# Patient Record
Sex: Female | Born: 1940 | Race: White | Hispanic: No | Smoking: Never smoker
Health system: Southern US, Community
[De-identification: ages and names within clinical notes are randomized; demographics above are authoritative.]

## PROBLEM LIST (undated history)

## (undated) DIAGNOSIS — A419 Sepsis, unspecified organism: Secondary | ICD-10-CM

## (undated) DIAGNOSIS — I10 Essential (primary) hypertension: Secondary | ICD-10-CM

## (undated) DIAGNOSIS — G459 Transient cerebral ischemic attack, unspecified: Secondary | ICD-10-CM

## (undated) DIAGNOSIS — N200 Calculus of kidney: Secondary | ICD-10-CM

## (undated) DIAGNOSIS — I4891 Unspecified atrial fibrillation: Secondary | ICD-10-CM

## (undated) HISTORY — DX: Sepsis, unspecified organism: A41.9

## (undated) HISTORY — DX: Transient cerebral ischemic attack, unspecified: G45.9

## (undated) HISTORY — DX: Calculus of kidney: N20.0

## (undated) HISTORY — PX: REPLACEMENT TOTAL KNEE: SUR1224

## (undated) HISTORY — PX: PILONIDAL CYST / SINUS EXCISION: SUR543

## (undated) HISTORY — PX: CHOLECYSTECTOMY: SHX55

## (undated) HISTORY — DX: Essential (primary) hypertension: I10

## (undated) HISTORY — DX: Unspecified atrial fibrillation: I48.91

---

## 2018-04-24 ENCOUNTER — Inpatient Hospital Stay
Admit: 2018-04-24 | Discharge: 2018-05-27 | Disposition: A | Discharge status: Skilled Nursing Facility | Payer: Medicare HMO | Source: Other Acute Inpatient Hospital | Attending: Internal Medicine | Admitting: Internal Medicine

## 2018-04-24 DIAGNOSIS — N179 Acute kidney failure, unspecified: Secondary | ICD-10-CM

## 2018-04-24 DIAGNOSIS — F05 Delirium due to known physiological condition: Secondary | ICD-10-CM | POA: Diagnosis present

## 2018-04-24 DIAGNOSIS — Z931 Gastrostomy status: Secondary | ICD-10-CM

## 2018-04-24 DIAGNOSIS — N189 Chronic kidney disease, unspecified: Secondary | ICD-10-CM

## 2018-04-24 DIAGNOSIS — J189 Pneumonia, unspecified organism: Secondary | ICD-10-CM

## 2018-04-24 DIAGNOSIS — Z09 Encounter for follow-up examination after completed treatment for conditions other than malignant neoplasm: Secondary | ICD-10-CM

## 2018-04-24 DIAGNOSIS — I69319 Unspecified symptoms and signs involving cognitive functions following cerebral infarction: Secondary | ICD-10-CM

## 2018-04-24 DIAGNOSIS — Z4659 Encounter for fitting and adjustment of other gastrointestinal appliance and device: Secondary | ICD-10-CM

## 2018-04-25 LAB — COMPREHENSIVE METABOLIC PANEL
ALT: 11 U/L — ABNORMAL LOW (ref 14–54)
AST: 33 U/L (ref 15–41)
Albumin: 1.9 g/dL — ABNORMAL LOW (ref 3.5–5.0)
Alkaline Phosphatase: 85 U/L (ref 38–126)
Anion gap: 5 (ref 5–15)
BUN: 14 mg/dL (ref 6–20)
CO2: 30 mmol/L (ref 22–32)
Calcium: 11.4 mg/dL — ABNORMAL HIGH (ref 8.9–10.3)
Chloride: 102 mmol/L (ref 101–111)
Creatinine, Ser: 0.97 mg/dL (ref 0.44–1.00)
GFR calc Af Amer: >60 mL/min (ref >60)
GFR calc non Af Amer: 56 mL/min — ABNORMAL LOW (ref >60)
Glucose, Bld: 97 mg/dL (ref 65–99)
Potassium: 4.5 mmol/L (ref 3.5–5.1)
Sodium: 137 mmol/L (ref 135–145)
Total Bilirubin: 0.5 mg/dL (ref 0.3–1.2)
Total Protein: 5.3 g/dL — ABNORMAL LOW (ref 6.5–8.1)

## 2018-04-25 LAB — CBC
HCT: 34.6 % — ABNORMAL LOW (ref 36.0–46.0)
Hemoglobin: 10.8 g/dL — ABNORMAL LOW (ref 12.0–15.0)
MCH: 26.5 pg (ref 26.0–34.0)
MCHC: 31.2 g/dL (ref 30.0–36.0)
MCV: 85 fL (ref 78.0–100.0)
Platelets: 127 10*3/uL — ABNORMAL LOW (ref 150–400)
RBC: 4.07 MIL/uL (ref 3.87–5.11)
RDW: 16.6 % — ABNORMAL HIGH (ref 11.5–15.5)
WBC: 10.8 10*3/uL — ABNORMAL HIGH (ref 4.0–10.5)

## 2018-04-25 LAB — MAGNESIUM: Magnesium: 2 mg/dL (ref 1.7–2.4)

## 2018-04-28 ENCOUNTER — Other Ambulatory Visit (HOSPITAL_COMMUNITY): Payer: Medicare HMO

## 2018-04-28 ENCOUNTER — Institutional Professional Consult (permissible substitution) (HOSPITAL_COMMUNITY): Payer: Medicare HMO

## 2018-04-28 MED ORDER — FLUCONAZOLE 200 MG PO TABS
200.00 | ORAL_TABLET | ORAL | Status: DC
Start: 2018-04-25 — End: 2018-04-28

## 2018-04-28 MED ORDER — ALBUTEROL SULFATE (5 MG/ML) 0.5% IN NEBU
2.50 | INHALATION_SOLUTION | RESPIRATORY_TRACT | Status: DC
Start: ? — End: 2018-04-28

## 2018-04-28 MED ORDER — ALBUTEROL SULFATE HFA 108 (90 BASE) MCG/ACT IN AERS
2.00 | INHALATION_SPRAY | RESPIRATORY_TRACT | Status: DC
Start: ? — End: 2018-04-28

## 2018-04-28 MED ORDER — GENERIC EXTERNAL MEDICATION
1.00 | Status: DC
Start: 2018-04-25 — End: 2018-04-28

## 2018-04-28 MED ORDER — GENERIC EXTERNAL MEDICATION
2.00 | Status: DC
Start: 2018-04-25 — End: 2018-04-28

## 2018-04-28 MED ORDER — GUAIFENESIN-DM 100-10 MG/5ML PO SYRP
5.00 | ORAL_SOLUTION | ORAL | Status: DC
Start: ? — End: 2018-04-28

## 2018-04-28 MED ORDER — ATORVASTATIN CALCIUM 10 MG PO TABS
20.00 | ORAL_TABLET | ORAL | Status: DC
Start: 2018-04-25 — End: 2018-04-28

## 2018-04-28 MED ORDER — LIDOCAINE 4 % EX PTCH
1.00 | MEDICATED_PATCH | CUTANEOUS | Status: DC
Start: 2018-04-25 — End: 2018-04-28

## 2018-04-28 MED ORDER — DIPHENHYDRAMINE HCL 25 MG PO CAPS
25.00 | ORAL_CAPSULE | ORAL | Status: DC
Start: ? — End: 2018-04-28

## 2018-04-28 MED ORDER — FAMOTIDINE 20 MG PO TABS
40.00 | ORAL_TABLET | ORAL | Status: DC
Start: 2018-04-25 — End: 2018-04-28

## 2018-04-28 MED ORDER — OXYCODONE-ACETAMINOPHEN 5-325 MG PO TABS
1.00 | ORAL_TABLET | ORAL | Status: DC
Start: ? — End: 2018-04-28

## 2018-04-28 MED ORDER — APIXABAN 5 MG PO TABS
5.00 | ORAL_TABLET | ORAL | Status: DC
Start: 2018-04-25 — End: 2018-04-28

## 2018-04-28 MED ORDER — ALUMINUM-MAGNESIUM-SIMETHICONE 200-200-20 MG/5ML PO SUSP
30.00 | ORAL | Status: DC
Start: ? — End: 2018-04-28

## 2018-04-28 MED ORDER — GENERIC EXTERNAL MEDICATION
1.00 g | Status: DC
Start: 2018-04-25 — End: 2018-04-28

## 2018-04-28 MED ORDER — METOPROLOL SUCCINATE ER 50 MG PO TB24
50.00 | ORAL_TABLET | ORAL | Status: DC
Start: 2018-04-25 — End: 2018-04-28

## 2018-04-28 MED ORDER — TRAMADOL HCL 50 MG PO TABS
50.00 | ORAL_TABLET | ORAL | Status: DC
Start: ? — End: 2018-04-28

## 2018-04-28 MED ORDER — HYDRALAZINE HCL 20 MG/ML IJ SOLN
10.00 | INTRAMUSCULAR | Status: DC
Start: ? — End: 2018-04-28

## 2018-04-28 MED ORDER — IPRATROPIUM BROMIDE 0.02 % IN SOLN
.50 | RESPIRATORY_TRACT | Status: DC
Start: ? — End: 2018-04-28

## 2018-04-28 MED ORDER — IOHEXOL 300 MG/ML  SOLN
100.0000 mL | Freq: Once | INTRAMUSCULAR | Status: AC | PRN
  Administered 2018-04-28: 100 mL via INTRAVENOUS

## 2018-04-28 MED ORDER — CLOTRIMAZOLE 1 % EX CREA
TOPICAL_CREAM | CUTANEOUS | Status: DC
Start: 2018-04-25 — End: 2018-04-28

## 2018-04-28 MED ORDER — MAGNESIUM OXIDE 400 MG PO TABS
400.00 | ORAL_TABLET | ORAL | Status: DC
Start: 2018-04-25 — End: 2018-04-28

## 2018-04-28 MED ORDER — GENERIC EXTERNAL MEDICATION
6.00 | Status: DC
Start: 2018-04-25 — End: 2018-04-28

## 2018-04-28 MED ORDER — GENERIC EXTERNAL MEDICATION
300.00 | Status: DC
Start: 2018-04-25 — End: 2018-04-28

## 2018-04-28 MED ORDER — IOPAMIDOL (ISOVUE-300) INJECTION 61%
30.0000 mL | INTRAVENOUS | Status: AC
Start: 2018-04-28 — End: 2018-04-28

## 2018-04-28 MED ORDER — ONDANSETRON HCL 4 MG/2ML IJ SOLN
4.00 | INTRAMUSCULAR | Status: DC
Start: ? — End: 2018-04-28

## 2018-04-28 MED ORDER — ACETAMINOPHEN 325 MG PO TABS
650.00 | ORAL_TABLET | ORAL | Status: DC
Start: ? — End: 2018-04-28

## 2018-04-28 MED ORDER — BISACODYL 5 MG PO TBEC
10.00 | DELAYED_RELEASE_TABLET | ORAL | Status: DC
Start: ? — End: 2018-04-28

## 2018-04-28 MED ORDER — MIDODRINE HCL 5 MG PO TABS
5.00 | ORAL_TABLET | ORAL | Status: DC
Start: 2018-04-25 — End: 2018-04-28

## 2018-05-01 LAB — CK: Total CK: 131 U/L (ref 38–234)

## 2018-05-03 DIAGNOSIS — I69319 Unspecified symptoms and signs involving cognitive functions following cerebral infarction: Secondary | ICD-10-CM

## 2018-05-03 DIAGNOSIS — F05 Delirium due to known physiological condition: Secondary | ICD-10-CM | POA: Diagnosis present

## 2018-05-03 LAB — POTASSIUM: Potassium: 3.3 mmol/L — ABNORMAL LOW (ref 3.5–5.1)

## 2018-05-03 NOTE — Consult Note (Signed)
[facility] Face-to-Face Psychiatry Consult   Reason for Consult: confusion, combative Referring Physician:  Dr. 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  HPI:   Patient is a 77 year old woman with history of Cerebrovascular accident, new onset Afib, acute on chronic renal failure, sacral decubitus and pneumonia who was found to have Sepsis, a femoral/iliopsoas abscess and acute left CVA prior to her admission to this hospital. Per chart review, patient was treated at [facility] where her hospital stay was complicated with post stroke delirium and psychosis. Patient is a poor historian and information were obtained from sister in law by her bedside, treating physician and her chart. Patient was reported to be aggressive, combative in the last few days which warranted antipsychotic medication management. Per report, she is showing some improvement today. She is alert and able to engage in some conversation but still appears confused. She is oriented to person but not to time and place. She does not appear combative but continues to thinks some people are out to get her and was indicating that some people a hovering around the window in her hospital room. Patient denies depressive symptoms or anxiety. She denies prior history of trauma.  Past Psychiatric History: unknown  Risk to Self:  denies Risk to Others:  denies Prior Inpatient Therapy:   Prior Outpatient Therapy:    Past Medical History: as in HPI Family History: No family history on file. Family Psychiatric  History: unknown Social History:  Social History   Substance and  Sexual Activity  Alcohol Use Not on file     Social History   Substance and Sexual Activity  Drug Use Not on file    Social History   Socioeconomic History  . Marital status: Unknown    Spouse name: Not on file  . Number of children: Not on file  . Years of education: Not on file  . Highest education level: Not on file  Occupational History  . Not on file  Social Needs  . Financial resource strain: Not on file  . Food insecurity:    Worry: Not on file    Inability: Not on file  . Transportation needs:    Medical: Not on file    Non-medical: Not on file  Tobacco Use  . Smoking status: Not on file  Substance and Sexual Activity  . Alcohol use: Not on file  . Drug use: Not on file  . Sexual activity: Not on file  Lifestyle  . Physical activity:    Days per week: Not on file    Minutes per session: Not on file  . Stress: Not on file  Relationships  . Social connections:    Talks on phone: Not on file    Gets together: Not on file    Attends religious service: Not on file    Active member of club or organization: Not on file    Attends meetings of clubs or organizations: Not on file    Relationship status: Not on file  Other Topics Concern  . Not on file  Social History Narrative  . Not on file   Additional Social History:  Allergies:  Allergies not on file  Labs:  Results for orders placed or performed during the hospital encounter of 04/24/18 (from the past 48 hour(s))  Potassium     Status: Abnormal   Collection Time: 05/03/18 11:40 AM  Result Value Ref Range   Potassium 3.3 (L) 3.5 - 5.1 mmol/L    Comment: Performed at [facility], [address] [place], [place] [address]    No current facility-administered medications for this encounter.     Musculoskeletal: Strength & Muscle Tone: within normal limits Gait & Station: unable to stand Patient leans: N/A  Psychiatric Specialty Exam: Physical Exam  Psychiatric: Judgment normal. Her  mood appears anxious. Her speech is delayed and tangential. She is actively hallucinating and combative. Thought content is paranoid. Cognition and memory are impaired.    Review of Systems  Constitutional: Positive for malaise/fatigue.  HENT: Negative.   Eyes: Negative.   Cardiovascular: Negative.   Gastrointestinal: Negative.   Genitourinary: Negative.   Skin: Negative.   Neurological: Positive for tremors.  Endo/Heme/Allergies: Negative.   Psychiatric/Behavioral: Positive for memory loss.    There were no vitals taken for this visit.There is no height or weight on file to calculate BMI.  General Appearance: Casual  Eye Contact:  Fair  Speech:  Pressured and Slow  Volume:  Decreased  Mood:  combative  Affect:  Blunt  Thought Process:  Disorganized  Orientation:  Other:  only to person  Thought Content:  Illogical, Delusions and Hallucinations: Visual  Suicidal Thoughts:  No  Homicidal Thoughts:  No  Memory:  Immediate;   Fair Recent;   Poor Remote;   Fair  Judgement:  Other:  marginal  Insight:  Shallow  Psychomotor Activity:  Restlessness  Concentration:  Concentration: Fair and Attention Span: Fair  Recall:  Poor  Fund of Knowledge:  Fair  Language:  Fair  Akathisia:  No  Handed:  Right  AIMS (if indicated):     Assets:  Desire for Improvement Social Support  ADL's:  Impaired  Cognition:  Impaired,  Mild  Sleep:   poor     Treatment Plan Summary: Patient with multiple medical problem who was found to have Sepsis,  femoral/iliopsoas abscess and acute left CVA. Since then, she has become more combative, paranoid, confused, disoriented with memory lapses.  The cause of her current mental status is unknown but may be explained by sudden stress, infection and or recent acute left CVA.   Plan/Recommendations: -Chart review. -Collateral information -Dementia work up. -Encourage family members to try to be at the patient's  bedside as much as they can. -Increase  Mirtazapine to 30 mg at bedtime for mood/sleep/appetite. -Continue Risperdal 0.25 mg for psychosis/paranoia/aggression.  Disposition:  Will follow up patient in 2 weeks. Re-consult as needed .  Thedore MinsMojeed Gali Spinney, MD 05/03/2018 3:30 PM

## 2018-05-04 LAB — COMPREHENSIVE METABOLIC PANEL
ALT: 35 U/L (ref 0–44)
ANION GAP: 11 (ref 5–15)
AST: 101 U/L — ABNORMAL HIGH (ref 15–41)
Albumin: 2 g/dL — ABNORMAL LOW (ref 3.5–5.0)
Alkaline Phosphatase: 74 U/L (ref 38–126)
BUN: 16 mg/dL (ref 8–23)
CHLORIDE: 109 mmol/L (ref 98–111)
CO2: 29 mmol/L (ref 22–32)
Calcium: 13.1 mg/dL (ref 8.9–10.3)
Creatinine, Ser: 1.74 mg/dL — ABNORMAL HIGH (ref 0.44–1.00)
GFR calc non Af Amer: 28 mL/min — ABNORMAL LOW (ref >60)
GFR, EST AFRICAN AMERICAN: 32 mL/min — AB (ref >60)
Glucose, Bld: 74 mg/dL (ref 70–99)
POTASSIUM: 3.2 mmol/L — AB (ref 3.5–5.1)
SODIUM: 149 mmol/L — AB (ref 135–145)
Total Bilirubin: 0.8 mg/dL (ref 0.3–1.2)
Total Protein: 5.3 g/dL — ABNORMAL LOW (ref 6.5–8.1)

## 2018-05-04 LAB — CBC
HCT: 33.4 % — ABNORMAL LOW (ref 36.0–46.0)
Hemoglobin: 9.8 g/dL — ABNORMAL LOW (ref 12.0–15.0)
MCH: 26.3 pg (ref 26.0–34.0)
MCHC: 29.3 g/dL — AB (ref 30.0–36.0)
MCV: 89.8 fL (ref 78.0–100.0)
PLATELETS: 274 10*3/uL (ref 150–400)
RBC: 3.72 MIL/uL — ABNORMAL LOW (ref 3.87–5.11)
RDW: 17.8 % — ABNORMAL HIGH (ref 11.5–15.5)
WBC: 10.2 10*3/uL (ref 4.0–10.5)

## 2018-05-04 LAB — T4, FREE: FREE T4: 0.72 ng/dL — AB (ref 0.82–1.77)

## 2018-05-04 LAB — TSH: TSH: 5.386 u[IU]/mL — ABNORMAL HIGH (ref 0.350–4.500)

## 2018-05-04 LAB — VITAMIN B12: VITAMIN B 12: 1358 pg/mL — AB (ref 180–914)

## 2018-05-05 LAB — PARATHYROID HORMONE, INTACT (NO CA): PTH: 15 pg/mL (ref 15–65)

## 2018-05-06 ENCOUNTER — Institutional Professional Consult (permissible substitution) (HOSPITAL_COMMUNITY): Payer: Medicare HMO

## 2018-05-06 LAB — CBC
HCT: 30.6 % — ABNORMAL LOW (ref 36.0–46.0)
Hemoglobin: 8.8 g/dL — ABNORMAL LOW (ref 12.0–15.0)
MCH: 26.4 pg (ref 26.0–34.0)
MCHC: 28.8 g/dL — AB (ref 30.0–36.0)
MCV: 91.9 fL (ref 78.0–100.0)
PLATELETS: 301 10*3/uL (ref 150–400)
RBC: 3.33 MIL/uL — ABNORMAL LOW (ref 3.87–5.11)
RDW: 18.3 % — AB (ref 11.5–15.5)
WBC: 10.3 10*3/uL (ref 4.0–10.5)

## 2018-05-06 LAB — BASIC METABOLIC PANEL
Anion gap: 8 (ref 5–15)
BUN: 17 mg/dL (ref 8–23)
CO2: 30 mmol/L (ref 22–32)
CREATININE: 1.67 mg/dL — AB (ref 0.44–1.00)
Calcium: 12.6 mg/dL — ABNORMAL HIGH (ref 8.9–10.3)
Chloride: 115 mmol/L — ABNORMAL HIGH (ref 98–111)
GFR calc Af Amer: 34 mL/min — ABNORMAL LOW (ref >60)
GFR, EST NON AFRICAN AMERICAN: 29 mL/min — AB (ref >60)
GLUCOSE: 84 mg/dL (ref 70–99)
Potassium: 3.6 mmol/L (ref 3.5–5.1)
SODIUM: 153 mmol/L — AB (ref 135–145)

## 2018-05-06 MED ORDER — IOPAMIDOL (ISOVUE-300) INJECTION 61%
INTRAVENOUS | Status: AC
  Filled 2018-05-06: qty 50

## 2018-05-08 LAB — BLOOD GAS, ARTERIAL
Acid-Base Excess: 0.8 mmol/L (ref 0.0–2.0)
Bicarbonate: 24 mmol/L (ref 20.0–28.0)
FIO2: 21
O2 SAT: 97.5 %
PCO2 ART: 30.7 mmHg — AB (ref 32.0–48.0)
PH ART: 7.499 — AB (ref 7.350–7.450)
Patient temperature: 96.4
pO2, Arterial: 84.5 mmHg (ref 83.0–108.0)

## 2018-05-08 LAB — BASIC METABOLIC PANEL
ANION GAP: 7 (ref 5–15)
BUN: 23 mg/dL (ref 8–23)
CALCIUM: 13.2 mg/dL — AB (ref 8.9–10.3)
CHLORIDE: 113 mmol/L — AB (ref 98–111)
CO2: 28 mmol/L (ref 22–32)
Creatinine, Ser: 1.64 mg/dL — ABNORMAL HIGH (ref 0.44–1.00)
GFR calc non Af Amer: 30 mL/min — ABNORMAL LOW (ref >60)
GFR, EST AFRICAN AMERICAN: 34 mL/min — AB (ref >60)
Glucose, Bld: 91 mg/dL (ref 70–99)
Potassium: 3 mmol/L — ABNORMAL LOW (ref 3.5–5.1)
Sodium: 148 mmol/L — ABNORMAL HIGH (ref 135–145)

## 2018-05-09 LAB — BASIC METABOLIC PANEL
ANION GAP: 7 (ref 5–15)
Anion gap: 4 — ABNORMAL LOW (ref 5–15)
Anion gap: 6 (ref 5–15)
BUN: 31 mg/dL — ABNORMAL HIGH (ref 8–23)
BUN: 30 mg/dL — AB (ref 8–23)
BUN: 30 mg/dL — ABNORMAL HIGH (ref 8–23)
CALCIUM: 12.9 mg/dL — AB (ref 8.9–10.3)
CHLORIDE: 110 mmol/L (ref 98–111)
CHLORIDE: 111 mmol/L (ref 98–111)
CHLORIDE: 112 mmol/L — AB (ref 98–111)
CO2: 26 mmol/L (ref 22–32)
CO2: 30 mmol/L (ref 22–32)
CO2: 28 mmol/L (ref 22–32)
CREATININE: 1.83 mg/dL — AB (ref 0.44–1.00)
Calcium: 13.2 mg/dL (ref 8.9–10.3)
Calcium: 12.8 mg/dL — ABNORMAL HIGH (ref 8.9–10.3)
Creatinine, Ser: 1.88 mg/dL — ABNORMAL HIGH (ref 0.44–1.00)
Creatinine, Ser: 1.73 mg/dL — ABNORMAL HIGH (ref 0.44–1.00)
GFR calc Af Amer: 29 mL/min — ABNORMAL LOW (ref >60)
GFR calc non Af Amer: 25 mL/min — ABNORMAL LOW (ref >60)
GFR, EST AFRICAN AMERICAN: 30 mL/min — AB (ref >60)
GFR, EST AFRICAN AMERICAN: 32 mL/min — AB (ref >60)
GFR, EST NON AFRICAN AMERICAN: 26 mL/min — AB (ref >60)
GFR, EST NON AFRICAN AMERICAN: 28 mL/min — AB (ref >60)
GLUCOSE: 95 mg/dL (ref 70–99)
Glucose, Bld: 94 mg/dL (ref 70–99)
Glucose, Bld: 84 mg/dL (ref 70–99)
Potassium: 4.1 mmol/L (ref 3.5–5.1)
Potassium: 3.6 mmol/L (ref 3.5–5.1)
Potassium: 3.7 mmol/L (ref 3.5–5.1)
SODIUM: 144 mmol/L (ref 135–145)
SODIUM: 145 mmol/L (ref 135–145)
Sodium: 145 mmol/L (ref 135–145)

## 2018-05-10 LAB — CALCIUM, IONIZED: Calcium, Ionized, Serum: 8 mg/dL — ABNORMAL HIGH (ref 4.5–5.6)

## 2018-05-10 LAB — BASIC METABOLIC PANEL
ANION GAP: 6 (ref 5–15)
ANION GAP: 5 (ref 5–15)
BUN: 35 mg/dL — ABNORMAL HIGH (ref 8–23)
BUN: 32 mg/dL — ABNORMAL HIGH (ref 8–23)
CALCIUM: 11.9 mg/dL — AB (ref 8.9–10.3)
CHLORIDE: 114 mmol/L — AB (ref 98–111)
CO2: 24 mmol/L (ref 22–32)
CO2: 28 mmol/L (ref 22–32)
Calcium: 11.5 mg/dL — ABNORMAL HIGH (ref 8.9–10.3)
Chloride: 112 mmol/L — ABNORMAL HIGH (ref 98–111)
Creatinine, Ser: 1.62 mg/dL — ABNORMAL HIGH (ref 0.44–1.00)
Creatinine, Ser: 1.55 mg/dL — ABNORMAL HIGH (ref 0.44–1.00)
GFR calc Af Amer: 37 mL/min — ABNORMAL LOW (ref >60)
GFR calc non Af Amer: 32 mL/min — ABNORMAL LOW (ref >60)
GFR, EST AFRICAN AMERICAN: 35 mL/min — AB (ref >60)
GFR, EST NON AFRICAN AMERICAN: 30 mL/min — AB (ref >60)
Glucose, Bld: 75 mg/dL (ref 70–99)
Glucose, Bld: 73 mg/dL (ref 70–99)
POTASSIUM: 3.7 mmol/L (ref 3.5–5.1)
POTASSIUM: 3.4 mmol/L — AB (ref 3.5–5.1)
SODIUM: 144 mmol/L (ref 135–145)
SODIUM: 145 mmol/L (ref 135–145)

## 2018-05-10 LAB — MAGNESIUM: Magnesium: 2 mg/dL (ref 1.7–2.4)

## 2018-05-11 ENCOUNTER — Other Ambulatory Visit (HOSPITAL_COMMUNITY): Payer: Medicare HMO

## 2018-05-11 NOTE — Consult Note (Signed)
[facility] CONSULT NOTE    Date: 05/11/2018                  Patient Name:  Bridget Riley  MRN: [id number]  DOB: 07/16/1941  Age / Sex: 77 y.o., female         PCP: No primary care provider on file.                 Service Requesting Consult: Hospitalist                 Reason for Consult: Acute renal failure, Hypercalcemia            History of Present Illness: Patient is a 77 y.o. female with a PMHx of large right staghorn calculus nephrolithiasis, chronic kidney disease stage III, hyperlipidemia, obstructive sleep apnea, chronic low back pain, obesity, recurrent urinary tract infection, chronic hypoxic respiratory failure, acute left temporal ischemic stroke, recent large left iliopsoas femoral abscess status post IR guided drainage April 10, 2018, who [facility] for ongoing treatment.  As above she originally presented with left iliopsoas abscess as well as a left perinephric abscess.  Patient underwent IR guided drainage on April 10, 2018.  She subsequently had a drain placed in the left perinephric region on April 13, 2018.  She was treated with antibiotic therapy.  She also has a large staghorn calculus in the right kidney and is to follow-up with urology as an outpatient.  She cannot provide any history at this point in time.  The patient's baseline creatinine appears to be 0.96.  Her calcium has also risen and is currently 11.9.  It has been as high as 13.2.   Medications: Outpatient medications:  Current medications: Amantadine 100 mg daily, Lipitor 20 mg daily, Biotene 45 mL's 4 times daily, Cubicin 500 mg every 48 hours, Eliquis 5 mg twice daily, famotidine 20 mg twice daily, ferrous sulfate 324 mg daily, fish oil 6 g daily, folic acid 1 mg daily, levothyroxine 25 mcg p.o. daily, Lidoderm patch, metoprolol 50 mg daily, modafinil 100 mg daily, pro-stat 30 cc 3 times daily, Risperdal 0.25 mg nightly, vitamin C 500 mg  daily  Allergies: Demeclocycline, montelukast, penicillins, sulfasalazine, prednisone, aspirin   Past Medical History: large right staghorn calculus nephrolithiasis, chronic kidney disease stage III, hyperlipidemia, obstructive sleep apnea, chronic low back pain, obesity, recurrent urinary tract infection, chronic hypoxic respiratory failure, acute left temporal ischemic stroke, recent large left iliopsoas femoral abscess status post IR guided drainage April 10, 2018  Past Surgical History: Left perinephric abscess status post drain placement Left iliopsoas abscess status post drain placement  Family History: No family history of ESRD.  Social History: Son lives with her, no tobacco, alcohol, illicit drug use.  Review of Systems: Unable to obtain from the patient as she is lethargic.  Vital Signs: Temperature 96.5 pulse 85 respirations 20 blood pressure 115/70 Weight trends: There were no vitals filed for this visit.  Physical Exam: General: NAD, laying in bed  Head: Normocephalic, atraumatic.  Eyes: Anicteric, EOMI  Nose: Mucous membranes dry, not inflammed, nonerythematous.  Throat: Oropharynx nonerythematous, no exudate appreciated.   Neck: Supple, trachea midline.  Lungs:  Normal respiratory effort. Clear to auscultation BL without crackles or wheezes.  Heart: RRR. S1 and S2 normal without gallop, murmur, or rubs.  Abdomen:  BS normoactive. Soft, Nondistended, non-tender.  No masses or organomegaly.  Extremities: No pretibial edema.  Neurologic: Lethargic but arousable  Skin: No visible rashes, scars.    Lab results: Basic Metabolic Panel: Recent Labs  Lab 05/09/18 1912 05/10/18 0811 05/10/18 1434  NA 145 144 145  K 3.7 3.7 3.4*  CL 111 114* 112*  CO2 28 24 28   GLUCOSE 84 75 73  BUN 30* 35* 32*  CREATININE 1.73* 1.62* 1.55*  CALCIUM 12.9* 11.5* 11.9*  MG  --   --  2.0    Liver Function Tests: No results for input(s): AST, ALT, ALKPHOS, BILITOT, PROT,  ALBUMIN in the last 168 hours. No results for input(s): LIPASE, AMYLASE in the last 168 hours. No results for input(s): AMMONIA in the last 168 hours.  CBC: Recent Labs  Lab 05/06/18 1040  WBC 10.3  HGB 8.8*  HCT 30.6*  MCV 91.9  PLT 301    Cardiac Enzymes: No results for input(s): CKTOTAL, CKMB, CKMBINDEX, TROPONINI in the last 168 hours.  BNP: Invalid input(s): POCBNP  CBG: No results for input(s): GLUCAP in the last 168 hours.  Microbiology: No results found for this or any previous visit.  Coagulation Studies: No results for input(s): LABPROT, INR in the last 72 hours.  Urinalysis: No results for input(s): COLORURINE, LABSPEC, PHURINE, GLUCOSEU, HGBUR, BILIRUBINUR, KETONESUR, PROTEINUR, UROBILINOGEN, NITRITE, LEUKOCYTESUR in the last 72 hours.  Invalid input(s): APPERANCEUR    Imaging:  No results found.   Assessment & Plan: Pt is a 77 y.o. female with a PMHx of large right staghorn calculus nephrolithiasis, chronic kidney disease stage III, hyperlipidemia, obstructive sleep apnea, chronic low back pain, obesity, recurrent urinary tract infection, chronic hypoxic respiratory failure, acute left temporal ischemic stroke, recent large left iliopsoas femoral abscess status post IR guided drainage April 10, 2018, who was [facility] for ongoing treatment.   1.  Acute renal failure. 2.  CKD stage III baseline EGFR 58. 3.  Hypercalcemia, suspect due to dehydration.  4.  Staghorn calculi R > L in size.   Plan:  We are asked to see the patient for evaluation management of acute renal failure.  Creatinine down to 1.5.  We will check renal ultrasound to make sure there is no underlying obstruction but suspect that hypercalcemia was playing a role in her acute renal failure.  In terms of hypercalcemia serum calcium was as high as 13.2.  The medronate has been ordered.  Calcium currently down to 11.9.  Suspect most likely that this is due to  dehydration.  We will start the patient on 0.9 normal saline at 40 cc/h.  Also check SPEP, UPEP, and intact PTH.  Further plan to be determined once his labs are available.  Thanks for consultation.

## 2018-05-12 LAB — BASIC METABOLIC PANEL
Anion gap: 4 — ABNORMAL LOW (ref 5–15)
BUN: 32 mg/dL — ABNORMAL HIGH (ref 8–23)
CHLORIDE: 114 mmol/L — AB (ref 98–111)
CO2: 29 mmol/L (ref 22–32)
Calcium: 12.1 mg/dL — ABNORMAL HIGH (ref 8.9–10.3)
Creatinine, Ser: 1.58 mg/dL — ABNORMAL HIGH (ref 0.44–1.00)
GFR calc non Af Amer: 31 mL/min — ABNORMAL LOW (ref >60)
GFR, EST AFRICAN AMERICAN: 36 mL/min — AB (ref >60)
Glucose, Bld: 112 mg/dL — ABNORMAL HIGH (ref 70–99)
POTASSIUM: 3.4 mmol/L — AB (ref 3.5–5.1)
SODIUM: 147 mmol/L — AB (ref 135–145)

## 2018-05-13 LAB — PROTEIN ELECTROPHORESIS, SERUM
A/G Ratio: 0.6 — ABNORMAL LOW (ref 0.7–1.7)
ALPHA-1-GLOBULIN: 0.3 g/dL (ref 0.0–0.4)
ALPHA-2-GLOBULIN: 0.6 g/dL (ref 0.4–1.0)
Albumin ELP: 1.7 g/dL — ABNORMAL LOW (ref 2.9–4.4)
Beta Globulin: 0.9 g/dL (ref 0.7–1.3)
GAMMA GLOBULIN: 0.9 g/dL (ref 0.4–1.8)
Globulin, Total: 2.8 g/dL (ref 2.2–3.9)
Total Protein ELP: 4.5 g/dL — ABNORMAL LOW (ref 6.0–8.5)

## 2018-05-13 LAB — BASIC METABOLIC PANEL
Anion gap: 4 — ABNORMAL LOW (ref 5–15)
BUN: 31 mg/dL — ABNORMAL HIGH (ref 8–23)
CHLORIDE: 111 mmol/L (ref 98–111)
CO2: 28 mmol/L (ref 22–32)
Calcium: 12.3 mg/dL — ABNORMAL HIGH (ref 8.9–10.3)
Creatinine, Ser: 1.49 mg/dL — ABNORMAL HIGH (ref 0.44–1.00)
GFR, EST AFRICAN AMERICAN: 39 mL/min — AB (ref >60)
GFR, EST NON AFRICAN AMERICAN: 33 mL/min — AB (ref >60)
Glucose, Bld: 86 mg/dL (ref 70–99)
Potassium: 3.4 mmol/L — ABNORMAL LOW (ref 3.5–5.1)
Sodium: 143 mmol/L (ref 135–145)

## 2018-05-13 NOTE — Progress Notes (Signed)
[facility]  ROUNDING NOTE   Subjective:  Patient seen at bedside. Calcium still high at 12.3. Patient resting comfortably in bed at the moment.   Objective:  Vital signs in last 24 hours:  Temperature 98.6 pulse 78 respiration 17 blood pressure 111/53  Physical Exam: General: No acute distress  Head: Normocephalic, atraumatic. Moist oral mucosal membranes  Eyes: Anicteric  Neck: Supple, trachea midline  Lungs:  Clear to auscultation, normal effort  Heart: S1S2 no rubs  Abdomen:  Soft, nontender, bowel sounds present  Extremities: 1+ peripheral edema.  Neurologic: Lethargic but arousable  Skin: No rashes       Basic Metabolic Panel: Recent Labs  Lab 05/09/18 1912 05/10/18 0811 05/10/18 1434 05/12/18 0648 05/13/18 0651  NA 145 144 145 147* 143  K 3.7 3.7 3.4* 3.4* 3.4*  CL 111 114* 112* 114* 111  CO2 _0 GLUCOSE 84 75 73 112* 86  BUN 30* 35* 32* 32* 31*  CREATININE 1.73* 1.62* 1.55* 1.58* 1.49*  CALCIUM 12.9* 11.5* 11.9* 12.1* 12.3*  MG  --   --  2.0  --   --     Liver Function Tests: No results for input(s): AST, ALT, ALKPHOS, BILITOT, PROT, ALBUMIN in the last 168 hours. No results for input(s): LIPASE, AMYLASE in the last 168 hours. No results for input(s): AMMONIA in the last 168 hours.  CBC: No results for input(s): WBC, NEUTROABS, HGB, HCT, MCV, PLT in the last 168 hours.  Cardiac Enzymes: No results for input(s): CKTOTAL, CKMB, CKMBINDEX, TROPONINI in the last 168 hours.  BNP: Invalid input(s): POCBNP  CBG: No results for input(s): GLUCAP in the last 168 hours.  Microbiology: No results found for this or any previous visit.  Coagulation Studies: No results for input(s): LABPROT, INR in the last 72 hours.  Urinalysis: No results for input(s): COLORURINE, LABSPEC, PHURINE, GLUCOSEU, HGBUR, BILIRUBINUR, KETONESUR, PROTEINUR, UROBILINOGEN, NITRITE, LEUKOCYTESUR in the last 72 hours.  Invalid input(s): APPERANCEUR     Imaging: US Renal  Result Date: 05/11/2018 CLINICAL DATA:  Renal failure with acute tubular necrosis superimposed on chronic kidney disease EXAM: RENAL / URINARY TRACT ULTRASOUND COMPLETE COMPARISON:  Abdominal CT 5 days ago FINDINGS: Right Kidney: Length: 10.4 cm. Smooth thinning of the cortex. Poor visualization of the hilum due to shadowing branching calculus by CT. No hydronephrosis Left Kidney: Length: 10 cm. Smooth thinning of the cortex. No hydronephrosis. Collection/mass below the inferior pole on previous CT is not visualized. Bladder: Appears normal for degree of bladder distention. IMPRESSION: 1. Symmetric renal atrophy. 2. Branching right renal calculus.  No hydronephrosis. 3. Left inferior perinephric collection/mass by recent CT is not visualized sonographically. Electronically Signed   By: Monte Fantasia M.D.   On: 05/11/2018 22:13     Medications:       Assessment/ Plan:  77 y.o. female with a PMHx of large right staghorn calculus nephrolithiasis, chronic kidney disease stage III, hyperlipidemia, obstructive sleep apnea, chronic low back pain, obesity, recurrent urinary tract infection, chronic hypoxic respiratory failure, acute left temporal ischemic stroke, recent large left iliopsoas femoral abscess status post IR guided drainage April 10, 2018, who was [facility]2018 for ongoing treatment.   1.  Acute renal failure. 2.  CKD stage III baseline EGFR 58. 3.  Hypercalcemia, suspect due to dehydration.  4.  Staghorn calculi R > L in size.   Plan:  Renal function continues to improve slowly.  Creatinine currently 1.49.  Serum sodium also corrected down to 143.  However calcium remains high at 12.3.  Medronate was ordered yesterday.  Hopefully calcium will start to come down.  Patient will also need staghorn calculus in the right kidney to be addressed as an outpatient.    LOS: 0 Bridget Riley 7/10/20194:37 PM

## 2018-05-14 ENCOUNTER — Other Ambulatory Visit (HOSPITAL_COMMUNITY): Payer: Medicare HMO

## 2018-05-14 LAB — UPEP/UIFE/LIGHT CHAINS/TP, 24-HR UR
% BETA, Urine: 45.4 %
ALPHA 1 URINE: 12.3 %
Albumin, U: 12.7 %
Alpha 2, Urine: 18.9 %
FREE KAPPA LT CHAINS, UR: 472 mg/L — AB (ref 1.35–24.19)
FREE KAPPA/LAMBDA RATIO: 4.18 (ref 2.04–10.37)
FREE LAMBDA LT CHAINS, UR: 113 mg/L — AB (ref 0.24–6.66)
GAMMA GLOBULIN URINE: 10.7 %
TOTAL PROTEIN, URINE-UR/DAY: 1360 mg/(24.h) — AB (ref 30–150)
Total Protein, Urine: 54.4 mg/dL
Total Volume: 2500

## 2018-05-14 LAB — CBC
HCT: 29.1 % — ABNORMAL LOW (ref 36.0–46.0)
HEMOGLOBIN: 9.1 g/dL — AB (ref 12.0–15.0)
MCH: 27.2 pg (ref 26.0–34.0)
MCHC: 31.3 g/dL (ref 30.0–36.0)
MCV: 86.9 fL (ref 78.0–100.0)
Platelets: 193 10*3/uL (ref 150–400)
RBC: 3.35 MIL/uL — AB (ref 3.87–5.11)
RDW: 19.3 % — ABNORMAL HIGH (ref 11.5–15.5)
WBC: 8.5 10*3/uL (ref 4.0–10.5)

## 2018-05-14 LAB — BASIC METABOLIC PANEL
ANION GAP: 8 (ref 5–15)
BUN: 25 mg/dL — ABNORMAL HIGH (ref 8–23)
CHLORIDE: 107 mmol/L (ref 98–111)
CO2: 25 mmol/L (ref 22–32)
Calcium: 12.6 mg/dL — ABNORMAL HIGH (ref 8.9–10.3)
Creatinine, Ser: 1.54 mg/dL — ABNORMAL HIGH (ref 0.44–1.00)
GFR calc non Af Amer: 32 mL/min — ABNORMAL LOW (ref >60)
GFR, EST AFRICAN AMERICAN: 37 mL/min — AB (ref >60)
Glucose, Bld: 70 mg/dL (ref 70–99)
POTASSIUM: 3.7 mmol/L (ref 3.5–5.1)
SODIUM: 140 mmol/L (ref 135–145)

## 2018-05-14 LAB — TSH: TSH: 7.37 u[IU]/mL — AB (ref 0.350–4.500)

## 2018-05-15 ENCOUNTER — Other Ambulatory Visit (HOSPITAL_COMMUNITY): Payer: Medicare HMO

## 2018-05-15 LAB — URINALYSIS, ROUTINE W REFLEX MICROSCOPIC
BILIRUBIN URINE: NEGATIVE
GLUCOSE, UA: NEGATIVE mg/dL
Hgb urine dipstick: NEGATIVE
KETONES UR: NEGATIVE mg/dL
Nitrite: NEGATIVE
PH: 8 (ref 5.0–8.0)
PROTEIN: 30 mg/dL — AB
Specific Gravity, Urine: 1.008 (ref 1.005–1.030)

## 2018-05-15 MED ORDER — VANCOMYCIN HCL IN DEXTROSE 1-5 GM/200ML-% IV SOLN: 1000.0000 mg | INTRAVENOUS | Status: AC

## 2018-05-15 NOTE — Progress Notes (Signed)
[facility]  ROUNDING NOTE   Subjective:  Patient still quite lethargic. Despite being administered pamidronate and serum calcium 12.6. SPEP and UPEP negative   Objective:  Vital signs in last 24 hours:  Temperature 98.3 pulse 92 respirations 24 blood pressure 141/50  Physical Exam: General: No acute distress  Head: Normocephalic, atraumatic. Moist oral mucosal membranes  Eyes: Anicteric  Neck: Supple, trachea midline  Lungs:  Clear to auscultation, normal effort  Heart: S1S2 no rubs  Abdomen:  Soft, nontender, bowel sounds present  Extremities: 1+ peripheral edema.  Neurologic: Lethargic but arousable  Skin: No rashes       Basic Metabolic Panel: Recent Labs  Lab 05/10/18 0811 05/10/18 1434 05/12/18 0648 05/13/18 0651 05/14/18 1906  NA 144 145 147* 143 140  K 3.7 3.4* 3.4* 3.4* 3.7  CL 114* 112* 114* 111 107  CO2 24 28 29 28 25   GLUCOSE 75 73 112* 86 70  BUN 35* 32* 32* 31* 25*  CREATININE 1.62* 1.55* 1.58* 1.49* 1.54*  CALCIUM 11.5* 11.9* 12.1* 12.3* 12.6*  MG  --  2.0  --   --   --     Liver Function Tests: No results for input(s): AST, ALT, ALKPHOS, BILITOT, PROT, ALBUMIN in the last 168 hours. No results for input(s): LIPASE, AMYLASE in the last 168 hours. No results for input(s): AMMONIA in the last 168 hours.  CBC: Recent Labs  Lab 05/14/18 1906  WBC 8.5  HGB 9.1*  HCT 29.1*  MCV 86.9  PLT 193    Cardiac Enzymes: No results for input(s): CKTOTAL, CKMB, CKMBINDEX, TROPONINI in the last 168 hours.  BNP: Invalid input(s): POCBNP  CBG: No results for input(s): GLUCAP in the last 168 hours.  Microbiology: No results found for this or any previous visit.  Coagulation Studies: No results for input(s): LABPROT, INR in the last 72 hours.  Urinalysis: Recent Labs    05/15/18 1500  COLORURINE YELLOW  LABSPEC 1.008  PHURINE 8.0  GLUCOSEU NEGATIVE  HGBUR NEGATIVE  BILIRUBINUR NEGATIVE  KETONESUR NEGATIVE  PROTEINUR 30*   NITRITE NEGATIVE  LEUKOCYTESUR LARGE*      Imaging: Dg Chest Port 1 View  Result Date: 05/15/2018 CLINICAL DATA:  Cough, shortness of breath, sepsis, stroke EXAM: PORTABLE CHEST 1 VIEW COMPARISON:  Portable exam 1209 hours compared to 04/28/2018 FINDINGS: Minimal enlargement of cardiac silhouette. Mediastinal contours and pulmonary vascularity normal. Mildly rotated to the RIGHT. Peribronchial thickening with mild RIGHT basilar atelectasis. Remaining lungs clear, with improved aeration at LEFT base versus previous study. No infiltrate, pleural effusion or pneumothorax. Bones demineralized. IMPRESSION: Mild bronchitic changes with subsegmental atelectasis RIGHT base and improved aeration at LEFT base. Minimal enlargement of cardiac silhouette. Electronically Signed   By: Lavonia Dana M.D.   On: 05/15/2018 12:21     Medications:       Assessment/ Plan:  77 y.o. female with a PMHx of large right staghorn calculus nephrolithiasis, chronic kidney disease stage III, hyperlipidemia, obstructive sleep apnea, chronic low back pain, obesity, recurrent urinary tract infection, chronic hypoxic respiratory failure, acute left temporal ischemic stroke, recent large left iliopsoas femoral abscess status post IR guided drainage April 10, 2018, who was [facility]2018 for ongoing treatment.   1.  Acute renal failure. 2.  CKD stage III baseline EGFR 58. 3.  Hypercalcemia, suspect due to dehydration.  4.  Staghorn calculi R > L in size.   Plan:  Patient still with acute renal failure.  Creatinine currently 1.54.  Calcium remains high at 12.6.  She was administered pamidronate which may take several days before her calcium starts coming down. Consider calcitonin as well as.  SPEP and UPEP were negative.  Awaiting PTH result.    LOS: 0 Irvine Glorioso 7/12/20194:17 PM

## 2018-05-15 NOTE — Consult Note (Signed)
Chief Complaint: Patient was seen in consultation today for dysphagia  Referring Physician(s): Dr. Sharyon Medicus  Supervising Physician: Irish Lack  Patient Status: [facility] - Out-pt  History of Present Illness: Bridget Riley is a 77 y.o. female with past medical history of CKD, recurrent UTI, CVA, delirium transferred from [facility] for ongoing care.  She has dysphagia related to her mental status.  She attempted bedside swallow evaluation this AM without success.  IR consulted for percutaneous gastrostomy tube placement at the request of Dr. Sharyon Medicus.   No past medical history on file.  Allergies: Patient has no allergy information on record.  Medications: Prior to Admission medications   Not on File     No family history on file.  Social History   Socioeconomic History  . Marital status: Unknown    Spouse name: Not on file  . Number of children: Not on file  . Years of education: Not on file  . Highest education level: Not on file  Occupational History  . Not on file  Social Needs  . Financial resource strain: Not on file  . Food insecurity:    Worry: Not on file    Inability: Not on file  . Transportation needs:    Medical: Not on file    Non-medical: Not on file  Tobacco Use  . Smoking status: Not on file  Substance and Sexual Activity  . Alcohol use: Not on file  . Drug use: Not on file  . Sexual activity: Not on file  Lifestyle  . Physical activity:    Days per week: Not on file    Minutes per session: Not on file  . Stress: Not on file  Relationships  . Social connections:    Talks on phone: Not on file    Gets together: Not on file    Attends religious service: Not on file    Active member of club or organization: Not on file    Attends meetings of clubs or organizations: Not on file    Relationship status: Not on file  Other Topics Concern  . Not on file  Social History Narrative  . Not on file     Review of Systems: A 12  point ROS discussed and pertinent positives are indicated in the HPI above.  All other systems are negative.  Review of Systems  Unable to perform ROS: Mental status change    Vital Signs: There were no vitals taken for this visit.  Physical Exam  Constitutional: She appears well-developed.  Cardiovascular: Normal rate, regular rhythm and normal heart sounds.  Pulmonary/Chest: Effort normal. No respiratory distress.  Occasional snores  Abdominal: Soft. Bowel sounds are normal. She exhibits no distension.  No tenderness or scars noted.   Neurological:  Does not sustain arousal or participate  Skin: Skin is warm and dry.  Psychiatric: She has a normal mood and affect. Her behavior is normal. Judgment and thought content normal.  Nursing note and vitals reviewed.    MD Evaluation Airway: WNL Heart: WNL Abdomen: WNL Chest/ Lungs: WNL ASA  Classification: 3 Mallampati/Airway Score: Three   Imaging: Ct Abdomen Pelvis Wo Contrast  Result Date: 05/06/2018 CLINICAL DATA:  77 year old female with abdominal pain and fever. EXAM: CT ABDOMEN AND PELVIS WITHOUT CONTRAST TECHNIQUE: Multidetector CT imaging of the abdomen and pelvis was performed following the standard protocol without IV contrast. COMPARISON:  04/28/2018 CT FINDINGS: Please note that parenchymal abnormalities may be missed without intravenous contrast. Streak artifact from  patient's arms slightly decreases sensitivity. Lower chest: Mild bibasilar atelectasis again noted. Hepatobiliary: The liver is unremarkable except for a stable cyst within the LOWER liver. No biliary dilatation. Patient is status post cholecystectomy. Pancreas: Unremarkable Spleen: Unremarkable Adrenals/Urinary Tract: Bilateral renal atrophy, probable cysts and a RIGHT staghorn calculus again noted. A stable 3 x 4.2 cm collection INFERIOR to the LEFT kidney is unchanged. A percutaneous catheter with tip posterior to the LOWER LEFT kidney again noted. The  adrenal glands and bladder are unremarkable. Stomach/Bowel: Stomach is within normal limits. No evidence of bowel wall thickening, distention, or inflammatory changes. Vascular/Lymphatic: Aortic atherosclerosis. No enlarged abdominal or pelvic lymph nodes. Reproductive: Uterus and bilateral adnexa are unremarkable. Other: No ascites or pneumoperitoneum. A paraumbilical hernia containing fat is unchanged. Musculoskeletal: No acute or suspicious bony abnormalities. IMPRESSION: Unchanged exam with stable 3 x 4.2 cm collection INFERIOR to the LEFT kidney. Percutaneous drainage catheter posterior to the LOWER LEFT kidney again noted. Aortic Atherosclerosis (ICD10-I70.0). Electronically Signed   By: Harmon PierJeffrey  Hu M.D.   On: 05/06/2018 21:04   Ct Head Wo Contrast  Result Date: 04/28/2018 CLINICAL DATA:  77 year old female with altered mental status of unknown cause. Initial encounter. EXAM: CT HEAD WITHOUT CONTRAST TECHNIQUE: Contiguous axial images were obtained from the base of the skull through the vertex without intravenous contrast. COMPARISON:  None. FINDINGS: Brain: No intracranial hemorrhage. Posterior limb internal capsule hypodensity bilaterally suggestive of result of prior infarct/chronic microvascular changes. No CT evidence of large acute infarct. Mild global atrophy. No intracranial mass lesion noted on this unenhanced exam. Vascular: Vascular calcifications. Skull: Minimal hyperostosis frontalis interna. Sinuses/Orbits: No acute orbital abnormality. Moderate mucosal thickening left maxillary sinus. Bony overgrowth left maxillary sinus may reflect changes of chronic sinusitis or prior trauma. Other: Mastoid air cells and middle ear cavities are clear. IMPRESSION: No intracranial hemorrhage. Posterior limb internal capsule hypodensity bilaterally suggestive of result of prior infarct/chronic microvascular changes. No CT evidence of large acute infarct. Mild global atrophy. Moderate mucosal thickening left  maxillary sinus. Electronically Signed   By: Lacy DuverneySteven  Olson M.D.   On: 04/28/2018 12:44   Ct Abdomen Pelvis W Contrast  Result Date: 04/28/2018 CLINICAL DATA:  Acute lymphadenitis. EXAM: CT ABDOMEN AND PELVIS WITH CONTRAST TECHNIQUE: Multidetector CT imaging of the abdomen and pelvis was performed using the standard protocol following bolus administration of intravenous contrast. CONTRAST:  100mL OMNIPAQUE IOHEXOL 300 MG/ML  SOLN COMPARISON:  None. FINDINGS: Lower chest: Mild patchy atelectasis at both lung bases. Hepatobiliary: 3.6 cm in diameter well-circumscribed low-density area at the caudal tip of the right lobe likely represent a benign cyst. Liver abscess not excluded but not favored. Previous cholecystectomy. Pancreas: Fatty atrophy.  No acute finding. Spleen: Normal Adrenals/Urinary Tract: Adrenal glands are normal. The left kidney shows a few scattered benign appearing cysts. Arising from the lower pole of left kidney, there is an unusual cluster low-density areas measuring in total about 4.2 cm in diameter. This could represent some sort of previous treated renal lesion. There is a drainage catheter in the posterior inferior retroperitoneal fat, but this does not appear to communicate with this area or relate to this area specifically. Drainage catheter is not associated with any residual fluid collection. 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 The upper pole collecting system contains tiny nonobstructing stone. The lower pole collecting system contains a staghorn calculus in there is atrophy of the lower renal parenchyma. No stone in the bladder. Small bit of air in the bladder,  presumably related to catheterization. Stomach/Bowel: No bowel obstruction or primary bowel lesion. Rectal balloon in place. Sigmoid diverticulosis without evidence of diverticulitis. Vascular/Lymphatic: Aortic atherosclerosis. No aneurysm. IVC is normal. No retroperitoneal adenopathy. Reproductive: Uterus is  unremarkable.  No pelvic mass. Other: No free fluid or air. Ventral hernia just to the right of midline containing only fat, with the hernia defect measuring 3.5 cm in diameter. Musculoskeletal: Advanced lower lumbar degenerative changes. IMPRESSION: Drainage catheter in the retroperitoneum on the left behind and inferior to the kidney. This does not appear to be associated with any residual retroperitoneal collection. Low-density lesion of the caudal tip of the right lobe of the liver, presumably a benign cyst. 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 No evidence of acute inflammation or obstruction however. Unusual grouping of low-density entities at the caudal tip of the left kidney. The appearance would be unusual for neoplastic mass lesion but could be a treated tumor, some unusual renal cysts or residua of a previous infection. Significance is doubtful. Ventral hernia containing only fat. Electronically Signed   By: Paulina Fusi M.D.   On: 04/28/2018 13:01   US Renal  Result Date: 05/11/2018 CLINICAL DATA:  Renal failure with acute tubular necrosis superimposed on chronic kidney disease EXAM: RENAL / URINARY TRACT ULTRASOUND COMPLETE COMPARISON:  Abdominal CT 5 days ago FINDINGS: Right Kidney: Length: 10.4 cm. Smooth thinning of the cortex. Poor visualization of the hilum due to shadowing branching calculus by CT. No hydronephrosis Left Kidney: Length: 10 cm. Smooth thinning of the cortex. No hydronephrosis. Collection/mass below the inferior pole on previous CT is not visualized. Bladder: Appears normal for degree of bladder distention. IMPRESSION: 1. Symmetric renal atrophy. 2. Branching right renal calculus.  No hydronephrosis. 3. Left inferior perinephric collection/mass by recent CT is not visualized sonographically. Electronically Signed   By: Marnee Spring M.D.   On: 05/11/2018 22:13   Dg Chest Port 1 View  Result Date:  05/15/2018 CLINICAL DATA:  Cough, shortness of breath, sepsis, stroke EXAM: PORTABLE CHEST 1 VIEW COMPARISON:  Portable exam 1209 hours compared to 04/28/2018 FINDINGS: Minimal enlargement of cardiac silhouette. Mediastinal contours and pulmonary vascularity normal. Mildly rotated to the RIGHT. Peribronchial thickening with mild RIGHT basilar atelectasis. Remaining lungs clear, with improved aeration at LEFT base versus previous study. No infiltrate, pleural effusion or pneumothorax. Bones demineralized. IMPRESSION: Mild bronchitic changes with subsegmental atelectasis RIGHT base and improved aeration at LEFT base. Minimal enlargement of cardiac silhouette. Electronically Signed   By: Ulyses Southward M.D.   On: 05/15/2018 12:21   Dg Chest Port 1 View  Result Date: 04/28/2018 CLINICAL DATA:  Shortness of breath EXAM: PORTABLE CHEST 1 VIEW COMPARISON:  None. FINDINGS: Cardiac shadow is at the upper limits of normal in size. The lungs are well aerated bilaterally. Mild left retrocardiac atelectatic changes are seen. No sizable effusion is noted. No bony abnormality is noted. IMPRESSION: Mild left retrocardiac atelectasis. Electronically Signed   By: Alcide Clever M.D.   On: 04/28/2018 09:02   Dg Abd Portable 1v  Result Date: 05/06/2018 CLINICAL DATA:  Encounter for NG tube. EXAM: PORTABLE ABDOMEN - 1 VIEW COMPARISON:  CT abdomen and pelvis 04/28/2018. FINDINGS: Nasogastric tube tip lies in the proximal duodenum. Pigtail drainage catheter overlies the LEFT mid abdomen posteriorly. Nonobstructive gas pattern. IMPRESSION: Nasogastric tube tip lies in the proximal duodenum, apparent satisfactory position. Electronically Signed   By: Elsie Stain M.D.   On: 05/06/2018  13:38    Labs:  CBC: Recent Labs    04/25/18 0755 05/04/18 0617 05/06/18 1040 05/14/18 1906  WBC 10.8* 10.2 10.3 8.5  HGB 10.8* 9.8* 8.8* 9.1*  HCT 34.6* 33.4* 30.6* 29.1*  PLT 127* 274 301 193    COAGS: No results for input(s): INR, APTT  in the last 8760 hours.  BMP: Recent Labs    05/10/18 1434 05/12/18 0648 05/13/18 0651 05/14/18 1906  NA 145 147* 143 140  K 3.4* 3.4* 3.4* 3.7  CL 112* 114* 111 107  CO2 28 29 28 25   GLUCOSE 73 112* 86 70  BUN 32* 32* 31* 25*  CALCIUM 11.9* 12.1* 12.3* 12.6*  CREATININE 1.55* 1.58* 1.49* 1.54*  GFRNONAA 32* 31* 33* 32*  GFRAA 37* 36* 39* 37*    LIVER FUNCTION TESTS: Recent Labs    04/25/18 0755 05/04/18 0617  BILITOT 0.5 0.8  AST 33 101*  ALT 11* 35  ALKPHOS 85 74  PROT 5.3* 5.3*  ALBUMIN 1.9* 2.0*    TUMOR MARKERS: No results for input(s): AFPTM, CEA, CA199, CHROMGRNA in the last 8760 hours.  Assessment and Plan: Dysphagia Patient with past medical history of CVA and ongoing MS changes resulting in dysphagia.  IR consulted for gastrostomy tube placement at the request of Dr. Sharyon Medicus.  Patient is not currently on thinners.  Consent obtained from family at beside.   Risks and benefits discussed including, but not limited to the need for a barium enema during the procedure, bleeding, infection, peritonitis, or damage to adjacent structures.  All questions were answered, patient is agreeable to proceed. Consent signed and in chart.  Plan for placement early next week.   Thank you for this interesting consult.  I greatly enjoyed meeting Bridget Riley and look forward to participating in their care.  A copy of this report was sent to the requesting provider on this date.  Electronically Signed: Hoyt Koch, PA 05/15/2018, 3:52 PM   I spent a total of 40 Minutes    in face to face in clinical consultation, greater than 50% of which was counseling/coordinating care for dysphagia.

## 2018-05-16 LAB — URINE CULTURE: Culture: NO GROWTH

## 2018-05-16 LAB — PTH-RELATED PEPTIDE: PTH-related peptide: <2 pmol/L

## 2018-05-18 ENCOUNTER — Other Ambulatory Visit (HOSPITAL_COMMUNITY): Payer: Medicare HMO

## 2018-05-18 HISTORY — PX: IR GASTROSTOMY TUBE MOD SED: IMG625

## 2018-05-18 LAB — BASIC METABOLIC PANEL
ANION GAP: 5 (ref 5–15)
BUN: 22 mg/dL (ref 8–23)
CHLORIDE: 106 mmol/L (ref 98–111)
CO2: 30 mmol/L (ref 22–32)
Calcium: 11.5 mg/dL — ABNORMAL HIGH (ref 8.9–10.3)
Creatinine, Ser: 2.26 mg/dL — ABNORMAL HIGH (ref 0.44–1.00)
GFR calc non Af Amer: 20 mL/min — ABNORMAL LOW (ref >60)
GFR, EST AFRICAN AMERICAN: 23 mL/min — AB (ref >60)
Glucose, Bld: 92 mg/dL (ref 70–99)
POTASSIUM: 3.1 mmol/L — AB (ref 3.5–5.1)
SODIUM: 141 mmol/L (ref 135–145)

## 2018-05-18 LAB — PROTIME-INR
INR: 1.13
PROTHROMBIN TIME: 14.4 s (ref 11.4–15.2)

## 2018-05-18 MED ORDER — VANCOMYCIN HCL IN DEXTROSE 1-5 GM/200ML-% IV SOLN
INTRAVENOUS | Status: AC
  Administered 2018-05-18: 1000 mg
  Filled 2018-05-18: qty 200

## 2018-05-18 MED ORDER — FENTANYL CITRATE (PF) 100 MCG/2ML IJ SOLN
INTRAMUSCULAR | Status: AC | PRN
  Administered 2018-05-18: 25 ug via INTRAVENOUS

## 2018-05-18 MED ORDER — MIDAZOLAM HCL 2 MG/2ML IJ SOLN
INTRAMUSCULAR | Status: AC | PRN
  Administered 2018-05-18: 1 mg via INTRAVENOUS

## 2018-05-18 MED ORDER — FENTANYL CITRATE (PF) 100 MCG/2ML IJ SOLN
INTRAMUSCULAR | Status: AC
  Filled 2018-05-18: qty 2

## 2018-05-18 MED ORDER — LIDOCAINE HCL (PF) 1 % IJ SOLN
INTRAMUSCULAR | Status: AC | PRN
  Administered 2018-05-18: 20 mL

## 2018-05-18 MED ORDER — GLUCAGON HCL RDNA (DIAGNOSTIC) 1 MG IJ SOLR
INTRAMUSCULAR | Status: AC | PRN
  Administered 2018-05-18: 1 mg via INTRAVENOUS

## 2018-05-18 MED ORDER — GLUCAGON HCL RDNA (DIAGNOSTIC) 1 MG IJ SOLR
INTRAMUSCULAR | Status: AC
  Filled 2018-05-18: qty 1

## 2018-05-18 MED ORDER — LIDOCAINE HCL 1 % IJ SOLN
INTRAMUSCULAR | Status: AC
  Filled 2018-05-18: qty 20

## 2018-05-18 MED ORDER — MIDAZOLAM HCL 2 MG/2ML IJ SOLN
INTRAMUSCULAR | Status: AC
  Filled 2018-05-18: qty 2

## 2018-05-18 MED ORDER — IOPAMIDOL (ISOVUE-300) INJECTION 61%
INTRAVENOUS | Status: AC
  Administered 2018-05-18: 15 mL
  Filled 2018-05-18: qty 50

## 2018-05-18 NOTE — Progress Notes (Signed)
Vancomycin 1gm IV given per Dr. Lowella DandyHenn.

## 2018-05-18 NOTE — Procedures (Signed)
Placement of 20 Fr gastrostomy tube.  Minimal blood loss and no immediate complication.   

## 2018-05-18 NOTE — Progress Notes (Signed)
[facility]  ROUNDING NOTE   Subjective:  Renal function has worsened. Creatinine up to 2.26. Calcium down to 11.5.   Objective:  Vital signs in last 24 hours:  Temperature 98.6 pulse 84 respirations 20 blood pressure 94/41  Physical Exam: General: No acute distress  Head: Normocephalic, atraumatic. Moist oral mucosal membranes  Eyes: Anicteric  Neck: Supple, trachea midline  Lungs:  Clear to auscultation, normal effort  Heart: S1S2 no rubs  Abdomen:  Soft, nontender, bowel sounds present  Extremities: 1+ peripheral edema.  Neurologic: Lethargic but arousable  Skin: No rashes       Basic Metabolic Panel: Recent Labs  Lab 05/12/18 0648 05/13/18 0651 05/14/18 1906 05/18/18 0654  NA 147* 143 140 141  K 3.4* 3.4* 3.7 3.1*  CL 114* 111 107 106  CO2 29 28 25 30   GLUCOSE 112* 86 70 92  BUN 32* 31* 25* 22  CREATININE 1.58* 1.49* 1.54* 2.26*  CALCIUM 12.1* 12.3* 12.6* 11.5*    Liver Function Tests: No results for input(s): AST, ALT, ALKPHOS, BILITOT, PROT, ALBUMIN in the last 168 hours. No results for input(s): LIPASE, AMYLASE in the last 168 hours. No results for input(s): AMMONIA in the last 168 hours.  CBC: Recent Labs  Lab 05/14/18 1906  WBC 8.5  HGB 9.1*  HCT 29.1*  MCV 86.9  PLT 193    Cardiac Enzymes: No results for input(s): CKTOTAL, CKMB, CKMBINDEX, TROPONINI in the last 168 hours.  BNP: Invalid input(s): POCBNP  CBG: No results for input(s): GLUCAP in the last 168 hours.  Microbiology: Results for orders placed or performed during the hospital encounter of 04/24/18  Culture, Urine     Status: None   Collection Time: 05/15/18  3:00 PM  Result Value Ref Range Status   Specimen Description URINE, RANDOM  Final   Special Requests NONE  Final   Culture   Final    NO GROWTH Performed at [facility], [address], [place], [place] [address]    Report Status 05/16/2018 FINAL  Final    Coagulation Studies: Recent Labs     05/18/18 0732  LABPROT 14.4  INR 1.13    Urinalysis: Recent Labs    05/15/18 1500  COLORURINE YELLOW  LABSPEC 1.008  PHURINE 8.0  GLUCOSEU NEGATIVE  HGBUR NEGATIVE  BILIRUBINUR NEGATIVE  KETONESUR NEGATIVE  PROTEINUR 30*  NITRITE NEGATIVE  LEUKOCYTESUR LARGE*      Imaging: No results found.   Medications:       Assessment/ Plan:  77 y.o. female with a PMHx of large right staghorn calculus nephrolithiasis, chronic kidney disease stage III, hyperlipidemia, obstructive sleep apnea, chronic low back pain, obesity, recurrent urinary tract infection, chronic hypoxic respiratory failure, acute left temporal ischemic stroke, recent large left iliopsoas femoral abscess status post IR guided drainage April 10, 2018, who was admitted [facility] 04/24/2018 for ongoing treatment.   1.  Acute renal failure. 2.  CKD stage III baseline EGFR 58. 3.  Hypercalcemia, suspect due to dehydration.  4.  Staghorn calculi R > L in size.   Plan:  Renal function is worsened.  Creatinine up to 2.2.  Calcium down to 11.5.  Blood pressure low at 94/41.  DC all current fluids and start the patient on 0.9 normal saline at 60 cc/h.  Continue to monitor renal parameters daily.  No urgent indication for dialysis at the moment.  Further plan as patient progresses.    LOS: 0 Aadvika Konen  7/15/20198:42 AM

## 2018-05-19 ENCOUNTER — Encounter (HOSPITAL_COMMUNITY): Payer: Self-pay | Admitting: Diagnostic Radiology

## 2018-05-19 LAB — RENAL FUNCTION PANEL
ALBUMIN: 1.5 g/dL — AB (ref 3.5–5.0)
Anion gap: 6 (ref 5–15)
BUN: 18 mg/dL (ref 8–23)
CHLORIDE: 108 mmol/L (ref 98–111)
CO2: 27 mmol/L (ref 22–32)
Calcium: 10.9 mg/dL — ABNORMAL HIGH (ref 8.9–10.3)
Creatinine, Ser: 2.34 mg/dL — ABNORMAL HIGH (ref 0.44–1.00)
GFR calc Af Amer: 22 mL/min — ABNORMAL LOW (ref >60)
GFR calc non Af Amer: 19 mL/min — ABNORMAL LOW (ref >60)
GLUCOSE: 74 mg/dL (ref 70–99)
PHOSPHORUS: 3.2 mg/dL (ref 2.5–4.6)
POTASSIUM: 3 mmol/L — AB (ref 3.5–5.1)
Sodium: 141 mmol/L (ref 135–145)

## 2018-05-19 NOTE — Progress Notes (Signed)
Referring Physician(s): Hijazi  Supervising Physician: Gilmer MorWagner, Jaime  Patient Status:  [facility] - [facility]  Chief Complaint:  Dysphagia  Subjective:  Doing OK. Only c/o her abdominal binder being uncomfortable.  Allergies: Patient has no allergy information on record.  Medications: Prior to Admission medications   Not on File     Vital Signs: BP 111/75 (BP Location: Right Arm)   Pulse 91   Resp 20   SpO2 95%   Physical Exam Awake and alert NAD Gtube in place, site looks good, no bleeding.  Imaging: Ir Gastrostomy Tube Mod Sed  Result Date: 05/19/2018 INDICATION: 77 year old with history of CVA, altered mental status and dysphagia. Patient needs percutaneous gastrostomy tube for nutrition. EXAM: PERCUTANEOUS GASTROSTOMY TUBE WITH FLUOROSCOPIC GUIDANCE Physician: Rachelle HoraAdam R. Henn, MD MEDICATIONS: Vancomycin 1 g; Antibiotics were administered within 1 hour of the procedure. Glucagon 1 mg IV ANESTHESIA/SEDATION: Versed 1 mg IV; Fentanyl 25 mcg IV Moderate Sedation Time:  19 minutes The patient was continuously monitored during the procedure by the interventional radiology nurse under my direct supervision. FLUOROSCOPY TIME:  Fluoroscopy Time: 4 minutes 12 seconds (32 mGy). COMPLICATIONS: None immediate. PROCEDURE: Informed consent was obtained for a percutaneous gastrostomy tube. The patient was placed on the interventional table. Fluoroscopy demonstrated gas in the transverse colon. An orogastric tube was placed with fluoroscopic guidance. The anterior abdomen was prepped and draped in sterile fashion. Maximal barrier sterile technique was utilized including caps, mask, sterile gowns, sterile gloves, sterile drape, hand hygiene and skin antiseptic. Stomach was inflated with air through the orogastric tube. The skin and subcutaneous tissues were anesthetized with 1% lidocaine. A 17 gauge needle was directed into the distended stomach with fluoroscopic guidance. A wire was advanced into  the stomach and a T-tact was deployed. A 9-French vascular sheath was placed and the orogastric tube was snared using a Gooseneck snare device. The orogastric tube and snare were pulled out of the patient's mouth. The snare device was connected to a 20-French gastrostomy tube. The snare device and gastrostomy tube were pulled through the patient's mouth and out the anterior abdominal wall. The gastrostomy tube was cut to an appropriate length. Contrast injection through gastrostomy tube confirmed placement within the stomach. Fluoroscopic images were obtained for documentation. The gastrostomy tube was flushed with normal saline. IMPRESSION: Successful fluoroscopic guided percutaneous gastrostomy tube placement. Electronically Signed   By: Richarda OverlieAdam  Henn M.D.   On: 05/19/2018 08:14   Dg Chest Port 1 View  Result Date: 05/15/2018 CLINICAL DATA:  Cough, shortness of breath, sepsis, stroke EXAM: PORTABLE CHEST 1 VIEW COMPARISON:  Portable exam 1209 hours compared to 04/28/2018 FINDINGS: Minimal enlargement of cardiac silhouette. Mediastinal contours and pulmonary vascularity normal. Mildly rotated to the RIGHT. Peribronchial thickening with mild RIGHT basilar atelectasis. Remaining lungs clear, with improved aeration at LEFT base versus previous study. No infiltrate, pleural effusion or pneumothorax. Bones demineralized. IMPRESSION: Mild bronchitic changes with subsegmental atelectasis RIGHT base and improved aeration at LEFT base. Minimal enlargement of cardiac silhouette. Electronically Signed   By: Ulyses SouthwardMark  Boles M.D.   On: 05/15/2018 12:21   Dg Abd Portable 1v  Result Date: 05/15/2018 CLINICAL DATA:  Enteric tube placement. EXAM: PORTABLE ABDOMEN - 1 VIEW COMPARISON:  05/15/2018 chest radiograph. FINDINGS: The enteric tube has been advanced with tip projecting over distal stomach. Right upper quadrant surgical clips, presumably cholecystectomy. Lower abdomen and right hemiabdomen are excluded from the field of  view. Visualized bowel gas pattern is normal. IMPRESSION: Enteric tube tip  has been advanced with tip projecting over distal stomach. Electronically Signed   By: Mitzi Hansen M.D.   On: 05/15/2018 22:38   Dg Abd Portable 1v  Result Date: 05/15/2018 CLINICAL DATA:  Nasogastric tube placement. EXAM: PORTABLE ABDOMEN - 1 VIEW COMPARISON:  05/06/2018 FINDINGS: Nasogastric tube passes below the diaphragm into the proximal stomach. Side hole lies at the level of the GE junction. Normal bowel gas pattern. IMPRESSION: 1. Nasogastric tube extends into the proximal stomach. Consider inserting another 5-10 cm to allow the side hole to fully into the stomach. Electronically Signed   By: Amie Portland M.D.   On: 05/15/2018 17:20    Labs:  CBC: Recent Labs    04/25/18 0755 05/04/18 0617 05/06/18 1040 05/14/18 1906  WBC 10.8* 10.2 10.3 8.5  HGB 10.8* 9.8* 8.8* 9.1*  HCT 34.6* 33.4* 30.6* 29.1*  PLT 127* 274 301 193    COAGS: Recent Labs    05/18/18 0732  INR 1.13    BMP: Recent Labs    05/13/18 0651 05/14/18 1906 05/18/18 0654 05/19/18 0709  NA 143 140 141 141  K 3.4* 3.7 3.1* 3.0*  CL 111 107 106 108  CO2 28 25 30 27   GLUCOSE 86 70 92 74  BUN 31* 25* 22 18  CALCIUM 12.3* 12.6* 11.5* 10.9*  CREATININE 1.49* 1.54* 2.26* 2.34*  GFRNONAA 33* 32* 20* 19*  GFRAA 39* 37* 23* 22*    LIVER FUNCTION TESTS: Recent Labs    04/25/18 0755 05/04/18 0617 05/19/18 0709  BILITOT 0.5 0.8  --   AST 33 101*  --   ALT 11* 35  --   ALKPHOS 85 74  --   PROT 5.3* 5.3*  --   ALBUMIN 1.9* 2.0* 1.5*    Assessment and Plan:  Dysphagia secondary to CVA  S/P gastrostomy tube placement by Dr. Lowella Dandy 05/18/18.  Ok to start tube feeds and routine Gtube care.  Electronically Signed: Gwynneth Macleod, PA-C 05/19/2018, 11:05 AM    I spent a total of 15 Minutes at the the patient's bedside AND on the patient's hospital floor or unit, greater than 50% of which was counseling/coordinating  care for f/u Gtube placement.

## 2018-05-20 LAB — RENAL FUNCTION PANEL
ALBUMIN: 1.5 g/dL — AB (ref 3.5–5.0)
ANION GAP: 8 (ref 5–15)
BUN: 19 mg/dL (ref 8–23)
CHLORIDE: 106 mmol/L (ref 98–111)
CO2: 25 mmol/L (ref 22–32)
Calcium: 10.9 mg/dL — ABNORMAL HIGH (ref 8.9–10.3)
Creatinine, Ser: 2.33 mg/dL — ABNORMAL HIGH (ref 0.44–1.00)
GFR calc Af Amer: 23 mL/min — ABNORMAL LOW (ref >60)
GFR, EST NON AFRICAN AMERICAN: 19 mL/min — AB (ref >60)
Glucose, Bld: 102 mg/dL — ABNORMAL HIGH (ref 70–99)
PHOSPHORUS: 2.8 mg/dL (ref 2.5–4.6)
POTASSIUM: 3.6 mmol/L (ref 3.5–5.1)
Sodium: 139 mmol/L (ref 135–145)

## 2018-05-20 NOTE — Progress Notes (Signed)
[facility]  ROUNDING NOTE   Subjective:  Advise patient kidney function about the same with a creatinine of 2.3. Serum calcium down to 10.9 however. Patient more awake and alert today.   Objective:  Vital signs in last 24 hours:  Temperature 96.9 pulse 87 respirations 16 blood pressure 135/62  Physical Exam: General: No acute distress  Head: Normocephalic, atraumatic. Moist oral mucosal membranes  Eyes: Anicteric  Neck: Supple, trachea midline  Lungs:  Clear to auscultation, normal effort  Heart: S1S2 no rubs  Abdomen:  Soft, nontender, bowel sounds present  Extremities: 1+ peripheral edema.  Neurologic: Awake, alert  Skin: No rashes       Basic Metabolic Panel: Recent Labs  Lab 05/14/18 1906 05/18/18 0654 05/19/18 0709 05/20/18 0615  NA 140 141 141 139  K 3.7 3.1* 3.0* 3.6  CL 107 106 108 106  CO2 25 30 27 25   GLUCOSE 70 92 74 102*  BUN 25* 22 18 19   CREATININE 1.54* 2.26* 2.34* 2.33*  CALCIUM 12.6* 11.5* 10.9* 10.9*  PHOS  --   --  3.2 2.8    Liver Function Tests: Recent Labs  Lab 05/19/18 0709 05/20/18 0615  ALBUMIN 1.5* 1.5*   No results for input(s): LIPASE, AMYLASE in the last 168 hours. No results for input(s): AMMONIA in the last 168 hours.  CBC: Recent Labs  Lab 05/14/18 1906  WBC 8.5  HGB 9.1*  HCT 29.1*  MCV 86.9  PLT 193    Cardiac Enzymes: No results for input(s): CKTOTAL, CKMB, CKMBINDEX, TROPONINI in the last 168 hours.  BNP: Invalid input(s): POCBNP  CBG: No results for input(s): GLUCAP in the last 168 hours.  Microbiology: Results for orders placed or performed during the hospital encounter of 04/24/18  Culture, Urine     Status: None   Collection Time: 05/15/18  3:00 PM  Result Value Ref Range Status   Specimen Description URINE, RANDOM  Final   Special Requests NONE  Final   Culture   Final    NO GROWTH Performed at [facility], [address], [place], [place] [address]    Report Status  05/16/2018 FINAL  Final    Coagulation Studies: Recent Labs    05/18/18 0732  LABPROT 14.4  INR 1.13    Urinalysis: No results for input(s): COLORURINE, LABSPEC, PHURINE, GLUCOSEU, HGBUR, BILIRUBINUR, KETONESUR, PROTEINUR, UROBILINOGEN, NITRITE, LEUKOCYTESUR in the last 72 hours.  Invalid input(s): APPERANCEUR    Imaging: Ir Gastrostomy Tube Mod Sed  Result Date: 05/19/2018 INDICATION: 77 year old with history of CVA, altered mental status and dysphagia. Patient needs percutaneous gastrostomy tube for nutrition. EXAM: PERCUTANEOUS GASTROSTOMY TUBE WITH FLUOROSCOPIC GUIDANCE Physician: Stephan Minister. Henn, MD MEDICATIONS: Vancomycin 1 g; Antibiotics were administered within 1 hour of the procedure. Glucagon 1 mg IV ANESTHESIA/SEDATION: Versed 1 mg IV; Fentanyl 25 mcg IV Moderate Sedation Time:  19 minutes The patient was continuously monitored during the procedure by the interventional radiology nurse under my direct supervision. FLUOROSCOPY TIME:  Fluoroscopy Time: 4 minutes 12 seconds (32 mGy). COMPLICATIONS: None immediate. PROCEDURE: Informed consent was obtained for a percutaneous gastrostomy tube. The patient was placed on the interventional table. Fluoroscopy demonstrated gas in the transverse colon. An orogastric tube was placed with fluoroscopic guidance. The anterior abdomen was prepped and draped in sterile fashion. Maximal barrier sterile technique was utilized including caps, mask, sterile gowns, sterile gloves, sterile drape, hand hygiene and skin antiseptic. Stomach was inflated with air through the orogastric tube. The  skin and subcutaneous tissues were anesthetized with 1% lidocaine. A 17 gauge needle was directed into the distended stomach with fluoroscopic guidance. A wire was advanced into the stomach and a T-tact was deployed. A 9-French vascular sheath was placed and the orogastric tube was snared using a Gooseneck snare device. The orogastric tube and snare were pulled out of the  patient's mouth. The snare device was connected to a 20-French gastrostomy tube. The snare device and gastrostomy tube were pulled through the patient's mouth and out the anterior abdominal wall. The gastrostomy tube was cut to an appropriate length. Contrast injection through gastrostomy tube confirmed placement within the stomach. Fluoroscopic images were obtained for documentation. The gastrostomy tube was flushed with normal saline. IMPRESSION: Successful fluoroscopic guided percutaneous gastrostomy tube placement. Electronically Signed   By: Markus Daft M.D.   On: 05/19/2018 08:14     Medications:       Assessment/ Plan:  77 y.o. female with a PMHx of large right staghorn calculus nephrolithiasis, chronic kidney disease stage III, hyperlipidemia, obstructive sleep apnea, chronic low back pain, obesity, recurrent urinary tract infection, chronic hypoxic respiratory failure, acute left temporal ischemic stroke, recent large left iliopsoas femoral abscess status post IR guided drainage April 10, 2018, who [facility] for ongoing treatment.   1.  Acute renal failure. 2.  CKD stage III baseline EGFR 58. 3.  Hypercalcemia, suspect due to dehydration. S/p Pamidronate.  4.  Staghorn calculi R > L in size.   Plan:  Renal function about the same with a creatinine of 2.3.  Calcium is come down to 10.9.  No urgent indication for dialysis at the moment. Continue to monitor renal function as well as serum calcium. Patient previously received pamidronate.   LOS: 0 Robert Sperl 7/17/201911:10 AM

## 2018-05-22 LAB — BASIC METABOLIC PANEL
ANION GAP: 7 (ref 5–15)
BUN: 18 mg/dL (ref 8–23)
CHLORIDE: 106 mmol/L (ref 98–111)
CO2: 27 mmol/L (ref 22–32)
CREATININE: 2.57 mg/dL — AB (ref 0.44–1.00)
Calcium: 11.5 mg/dL — ABNORMAL HIGH (ref 8.9–10.3)
GFR calc non Af Amer: 17 mL/min — ABNORMAL LOW (ref >60)
GFR, EST AFRICAN AMERICAN: 20 mL/min — AB (ref >60)
GLUCOSE: 102 mg/dL — AB (ref 70–99)
Potassium: 3 mmol/L — ABNORMAL LOW (ref 3.5–5.1)
Sodium: 140 mmol/L (ref 135–145)

## 2018-05-22 LAB — CBC
HEMATOCRIT: 27.1 % — AB (ref 36.0–46.0)
Hemoglobin: 8 g/dL — ABNORMAL LOW (ref 12.0–15.0)
MCH: 27 pg (ref 26.0–34.0)
MCHC: 29.5 g/dL — AB (ref 30.0–36.0)
MCV: 91.6 fL (ref 78.0–100.0)
PLATELETS: 192 10*3/uL (ref 150–400)
RBC: 2.96 MIL/uL — ABNORMAL LOW (ref 3.87–5.11)
RDW: 21.2 % — AB (ref 11.5–15.5)
WBC: 13.6 10*3/uL — ABNORMAL HIGH (ref 4.0–10.5)

## 2018-05-22 NOTE — Progress Notes (Signed)
  [facility]  ROUNDING NOTE   Subjective:  Creatinine still rising and currently 2.57. Calcium 11.5.   Objective:  Vital signs in last 24 hours:  Temperature 97 pulse 88 respirations 21 blood pressure 101/73  Physical Exam: General: No acute distress  Head: Normocephalic, atraumatic. Moist oral mucosal membranes  Eyes: Anicteric  Neck: Supple, trachea midline  Lungs:  Clear to auscultation, normal effort  Heart: S1S2 no rubs  Abdomen:  Soft, nontender, bowel sounds present  Extremities: 1+ peripheral edema.  Neurologic: Awake, alert  Skin: No rashes       Basic Metabolic Panel: Recent Labs  Lab 05/18/18 0654 05/19/18 0709 05/20/18 0615 05/22/18 0627  NA 141 141 139 140  K 3.1* 3.0* 3.6 3.0*  CL 106 108 106 106  CO2 '30 27 25 27  '$ GLUCOSE 92 74 102* 102*  BUN '22 18 19 18  '$ CREATININE 2.26* 2.34* 2.33* 2.57*  CALCIUM 11.5* 10.9* 10.9* 11.5*  PHOS  --  3.2 2.8  --     Liver Function Tests: Recent Labs  Lab 05/19/18 0709 05/20/18 0615  ALBUMIN 1.5* 1.5*   No results for input(s): LIPASE, AMYLASE in the last 168 hours. No results for input(s): AMMONIA in the last 168 hours.  CBC: Recent Labs  Lab 05/22/18 0627  WBC 13.6*  HGB 8.0*  HCT 27.1*  MCV 91.6  PLT 192    Cardiac Enzymes: No results for input(s): CKTOTAL, CKMB, CKMBINDEX, TROPONINI in the last 168 hours.  BNP: Invalid input(s): POCBNP  CBG: No results for input(s): GLUCAP in the last 168 hours.  Microbiology: Results for orders placed or performed during the hospital encounter of 04/24/18  Culture, Urine     Status: None   Collection Time: 05/15/18  3:00 PM  Result Value Ref Range Status   Specimen Description URINE, RANDOM  Final   Special Requests NONE  Final   Culture   Final    NO GROWTH Performed at [facility], [address] [place], [place] [address]    Report Status 05/16/2018 FINAL  Final    Coagulation Studies: No results for input(s): LABPROT,  INR in the last 72 hours.  Urinalysis: No results for input(s): COLORURINE, LABSPEC, PHURINE, GLUCOSEU, HGBUR, BILIRUBINUR, KETONESUR, PROTEINUR, UROBILINOGEN, NITRITE, LEUKOCYTESUR in the last 72 hours.  Invalid input(s): APPERANCEUR    Imaging: No results found.   Medications:       Assessment/ Plan:  77 y.o. female with a PMHx of large right staghorn calculus nephrolithiasis, chronic kidney disease stage III, hyperlipidemia, obstructive sleep apnea, chronic low back pain, obesity, recurrent urinary tract infection, chronic hypoxic respiratory failure, acute left temporal ischemic stroke, recent large left iliopsoas femoral abscess status post IR guided drainage April 10, 2018, who was [facility] for ongoing treatment.   1.  Acute renal failure. 2.  CKD stage III baseline EGFR 58. 3.  Hypercalcemia, suspect due to dehydration. S/p Pamidronate.  4.  Staghorn calculi R > L in size.   Plan:  Calcium was down to 10.9 but now back up to 11.5.  Creatinine also remains high at 2.57.  SPEP was found to be negative.  PTH also was suppressed appropriately.  Start the patient on calcitonin as well.    LOS: 0 Shaw Dobek 7/19/20198:34 AM

## 2018-05-24 LAB — BASIC METABOLIC PANEL
Anion gap: 5 (ref 5–15)
BUN: 19 mg/dL (ref 8–23)
CHLORIDE: 109 mmol/L (ref 98–111)
CO2: 26 mmol/L (ref 22–32)
Calcium: 10.9 mg/dL — ABNORMAL HIGH (ref 8.9–10.3)
Creatinine, Ser: 2.45 mg/dL — ABNORMAL HIGH (ref 0.44–1.00)
GFR calc Af Amer: 21 mL/min — ABNORMAL LOW (ref >60)
GFR calc non Af Amer: 18 mL/min — ABNORMAL LOW (ref >60)
Glucose, Bld: 88 mg/dL (ref 70–99)
POTASSIUM: 3.2 mmol/L — AB (ref 3.5–5.1)
Sodium: 140 mmol/L (ref 135–145)

## 2018-05-25 NOTE — Progress Notes (Signed)
  [facility]  ROUNDING NOTE   Subjective:  Calcium down to 10.5. Creatinine slowly trending down and currently 2.45. Patient much more awake and alert at the moment.   Objective:  Vital signs in last 24 hours:  Temperature 97 pulse 88 respirations 21 blood pressure 101/73  Physical Exam: General: No acute distress  Head: Normocephalic, atraumatic. Moist oral mucosal membranes  Eyes: Anicteric  Neck: Supple, trachea midline  Lungs:  Clear to auscultation, normal effort  Heart: S1S2 no rubs  Abdomen:  Soft, nontender, bowel sounds present  Extremities: 1+ peripheral edema.  Neurologic: Awake, alert  Skin: No rashes       Basic Metabolic Panel: Recent Labs  Lab 05/19/18 0709 05/20/18 0615 05/22/18 0627 05/24/18 0749  NA 141 139 140 140  K 3.0* 3.6 3.0* 3.2*  CL 108 106 106 109  CO2 _0 GLUCOSE 74 102* 102* 88  BUN _1 CREATININE 2.34* 2.33* 2.57* 2.45*  CALCIUM 10.9* 10.9* 11.5* 10.9*  PHOS 3.2 2.8  --   --     Liver Function Tests: Recent Labs  Lab 05/19/18 0709 05/20/18 0615  ALBUMIN 1.5* 1.5*   No results for input(s): LIPASE, AMYLASE in the last 168 hours. No results for input(s): AMMONIA in the last 168 hours.  CBC: Recent Labs  Lab 05/22/18 0627  WBC 13.6*  HGB 8.0*  HCT 27.1*  MCV 91.6  PLT 192    Cardiac Enzymes: No results for input(s): CKTOTAL, CKMB, CKMBINDEX, TROPONINI in the last 168 hours.  BNP: Invalid input(s): POCBNP  CBG: No results for input(s): GLUCAP in the last 168 hours.  Microbiology: Results for orders placed or performed during the hospital encounter of 04/24/18  Culture, Urine     Status: None   Collection Time: 05/15/18  3:00 PM  Result Value Ref Range Status   Specimen Description URINE, RANDOM  Final   Special Requests NONE  Final   Culture   Final    NO GROWTH Performed at [facility], [address], [place], [place] [address]    Report Status 05/16/2018 FINAL   Final    Coagulation Studies: No results for input(s): LABPROT, INR in the last 72 hours.  Urinalysis: No results for input(s): COLORURINE, LABSPEC, PHURINE, GLUCOSEU, HGBUR, BILIRUBINUR, KETONESUR, PROTEINUR, UROBILINOGEN, NITRITE, LEUKOCYTESUR in the last 72 hours.  Invalid input(s): APPERANCEUR    Imaging: No results found.   Medications:       Assessment/ Plan:  77 y.o. female with a PMHx of large right staghorn calculus nephrolithiasis, chronic kidney disease stage III, hyperlipidemia, obstructive sleep apnea, chronic low back pain, obesity, recurrent urinary tract infection, chronic hypoxic respiratory failure, acute left temporal ischemic stroke, recent large left iliopsoas femoral abscess status post IR guided drainage April 10, 2018, who was [facility] for ongoing treatment.   1.  Acute renal failure. 2.  CKD stage III baseline EGFR 58. 3.  Hypercalcemia, suspect due to dehydration. S/p Pamidronate.  4.  Staghorn calculi R > L in size.   Plan:  Renal function about the same as creatinine currently 2.45.  Serum calcium down to 10.9.  Patient was previously administered pamidronate.  Calcitonin ordered but pt not receiving, will discuss with pharmacy.  Otherwise continue supportive.     LOS: 0 Carizma Dunsworth 7/22/20198:06 AM

## 2018-05-27 LAB — BASIC METABOLIC PANEL
Anion gap: 7 (ref 5–15)
BUN: 22 mg/dL (ref 8–23)
CO2: 25 mmol/L (ref 22–32)
CREATININE: 2.49 mg/dL — AB (ref 0.44–1.00)
Calcium: 11 mg/dL — ABNORMAL HIGH (ref 8.9–10.3)
Chloride: 108 mmol/L (ref 98–111)
GFR calc Af Amer: 21 mL/min — ABNORMAL LOW (ref >60)
GFR, EST NON AFRICAN AMERICAN: 18 mL/min — AB (ref >60)
GLUCOSE: 98 mg/dL (ref 70–99)
Potassium: 3.3 mmol/L — ABNORMAL LOW (ref 3.5–5.1)
Sodium: 140 mmol/L (ref 135–145)

## 2018-05-27 NOTE — Progress Notes (Signed)
  [facility]  ROUNDING NOTE   Subjective:  No new renal function testing available today. Last calcium was 10.9. Calcitonin not available at our facility.   Objective:  Vital signs in last 24 hours:  Temperature 97 pulse 90 respirations 25 blood pressure 102/54  Physical Exam: General: No acute distress  Head: Normocephalic, atraumatic. Moist oral mucosal membranes  Eyes: Anicteric  Neck: Supple, trachea midline  Lungs:  Clear to auscultation, normal effort  Heart: S1S2 no rubs  Abdomen:  Soft, nontender, bowel sounds present  Extremities: 1+ peripheral edema.  Neurologic: Awake, alert  Skin: No rashes       Basic Metabolic Panel: Recent Labs  Lab 05/22/18 0627 05/24/18 0749  NA 140 140  K 3.0* 3.2*  CL 106 109  CO2 27 26  GLUCOSE 102* 88  BUN 18 19  CREATININE 2.57* 2.45*  CALCIUM 11.5* 10.9*    Liver Function Tests: No results for input(s): AST, ALT, ALKPHOS, BILITOT, PROT, ALBUMIN in the last 168 hours. No results for input(s): LIPASE, AMYLASE in the last 168 hours. No results for input(s): AMMONIA in the last 168 hours.  CBC: Recent Labs  Lab 05/22/18 0627  WBC 13.6*  HGB 8.0*  HCT 27.1*  MCV 91.6  PLT 192    Cardiac Enzymes: No results for input(s): CKTOTAL, CKMB, CKMBINDEX, TROPONINI in the last 168 hours.  BNP: Invalid input(s): POCBNP  CBG: No results for input(s): GLUCAP in the last 168 hours.  Microbiology: Results for orders placed or performed during the hospital encounter of 04/24/18  Culture, Urine     Status: None   Collection Time: 05/15/18  3:00 PM  Result Value Ref Range Status   Specimen Description URINE, RANDOM  Final   Special Requests NONE  Final   Culture   Final    NO GROWTH Performed at [facility], [address] [place], [place] [address]    Report Status 05/16/2018 FINAL  Final    Coagulation Studies: No results for input(s): LABPROT, INR in the last 72 hours.  Urinalysis: No results  for input(s): COLORURINE, LABSPEC, PHURINE, GLUCOSEU, HGBUR, BILIRUBINUR, KETONESUR, PROTEINUR, UROBILINOGEN, NITRITE, LEUKOCYTESUR in the last 72 hours.  Invalid input(s): APPERANCEUR    Imaging: No results found.   Medications:       Assessment/ Plan:  77 y.o. female with a PMHx of large right staghorn calculus nephrolithiasis, chronic kidney disease stage III, hyperlipidemia, obstructive sleep apnea, chronic low back pain, obesity, recurrent urinary tract infection, chronic hypoxic respiratory failure, acute left temporal ischemic stroke, recent large left iliopsoas femoral abscess status post IR guided drainage April 10, 2018, who was [facility] for ongoing treatment.   1.  Acute renal failure. 2.  CKD stage III baseline EGFR 58. 3.  Hypercalcemia, suspect due to dehydration. S/p Pamidronate.  4.  Staghorn calculi R > L in size.   Plan:  We plan to repeat renal function as well as serum calcium today.  Most recent creatinine was found to be 2.45.  Her mental status has improved over the past week.  Acetone and not available at our facility.  Patient already received pamidronate.  Continue to monitor renal function as well as serum calcium periodically.   LOS: 0 Johne Buckle 7/24/20198:39 AM

## 2018-09-21 ENCOUNTER — Other Ambulatory Visit (HOSPITAL_COMMUNITY): Payer: Self-pay | Admitting: Gastroenterology

## 2018-09-22 ENCOUNTER — Ambulatory Visit (HOSPITAL_COMMUNITY)
Admission: RE | Admit: 2018-09-22 | Discharge: 2018-09-22 | Disposition: A | Discharge status: Home / Self Care | Payer: Medicare HMO | Source: Ambulatory Visit | Attending: Interventional Radiology | Admitting: Interventional Radiology

## 2018-09-22 ENCOUNTER — Other Ambulatory Visit (HOSPITAL_COMMUNITY): Payer: Self-pay | Admitting: Interventional Radiology

## 2018-09-22 ENCOUNTER — Encounter (HOSPITAL_COMMUNITY): Payer: Self-pay | Admitting: Interventional Radiology

## 2018-09-22 DIAGNOSIS — Z431 Encounter for attention to gastrostomy: Secondary | ICD-10-CM | POA: Insufficient documentation

## 2018-09-22 HISTORY — PX: IR GASTROSTOMY TUBE REMOVAL: IMG5492

## 2018-09-22 MED ORDER — LIDOCAINE VISCOUS HCL 2 % MT SOLN
OROMUCOSAL | Status: DC
Start: 2018-09-22 — End: 2018-09-23
  Filled 2018-09-22: qty 15

## 2018-09-22 MED ORDER — LIDOCAINE VISCOUS HCL 2 % MT SOLN
OROMUCOSAL | Status: AC | PRN
  Administered 2018-09-22: 15 mL via OROMUCOSAL

## 2019-04-29 IMAGING — RF DG SWALLOWING FUNCTION - NRPT [facility]
12 of 14 series · 13 of 24 positions shown · non-contrast
Comparison: none

[Series 1: run · 1 of 92 frames shown (1 of 12)]
[frame 2/92]
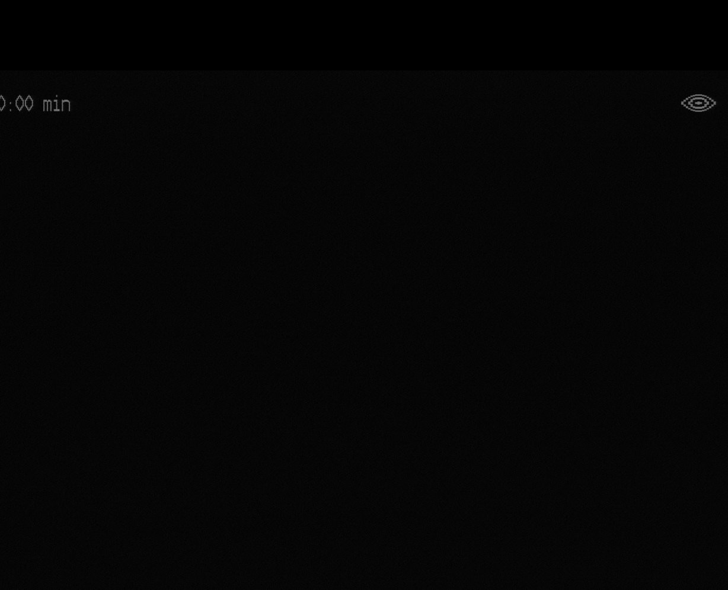

[Series 2: run · 1 of 340 frames shown (2 of 12)]
[frame 171/340]
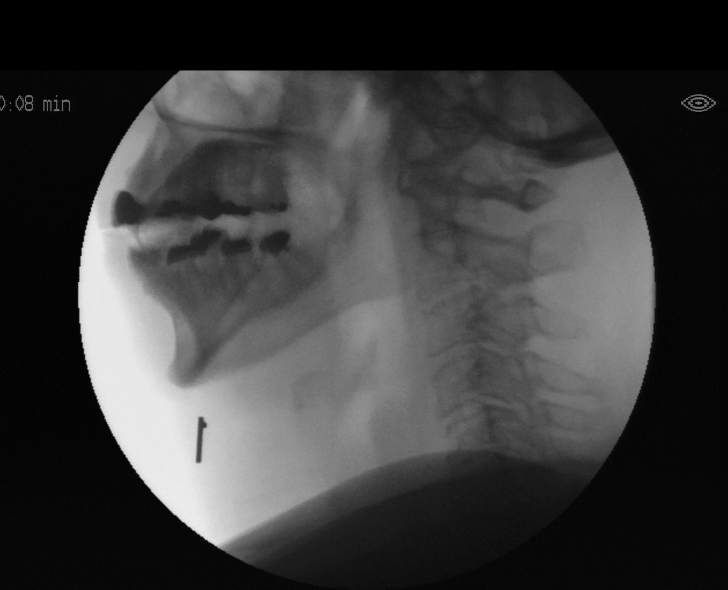

[Series 3: run · 1 of 569 frames shown (3 of 12)]
[frame 285/569]
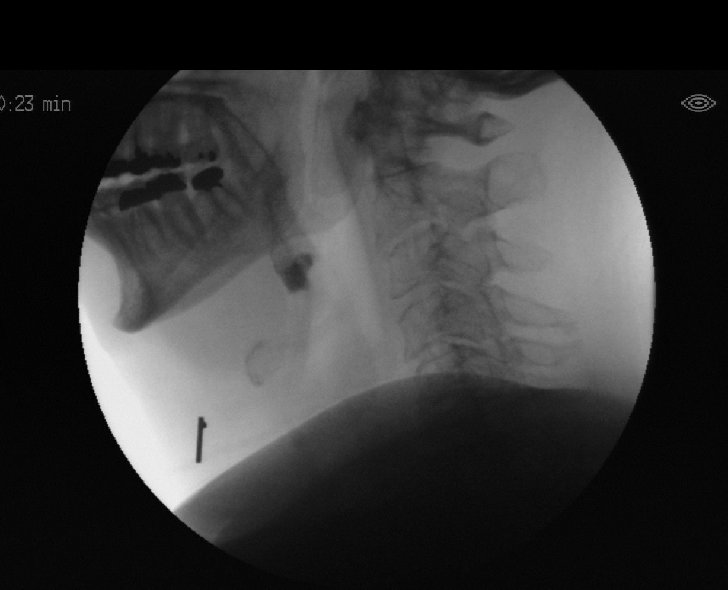

[Series 4: run · 1 of 63 frames shown (4 of 12)]
[frame 42/63]
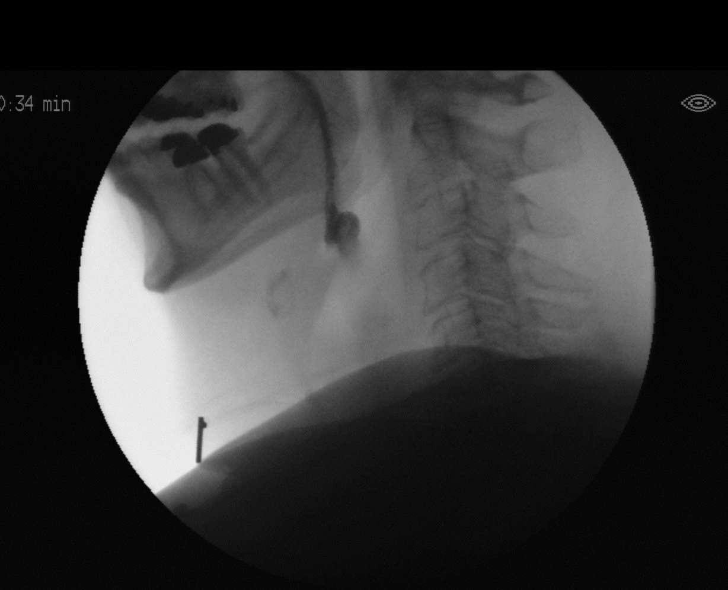

[Series 5: run · 1 of 165 frames shown (5 of 12)]
[frame 141/165]
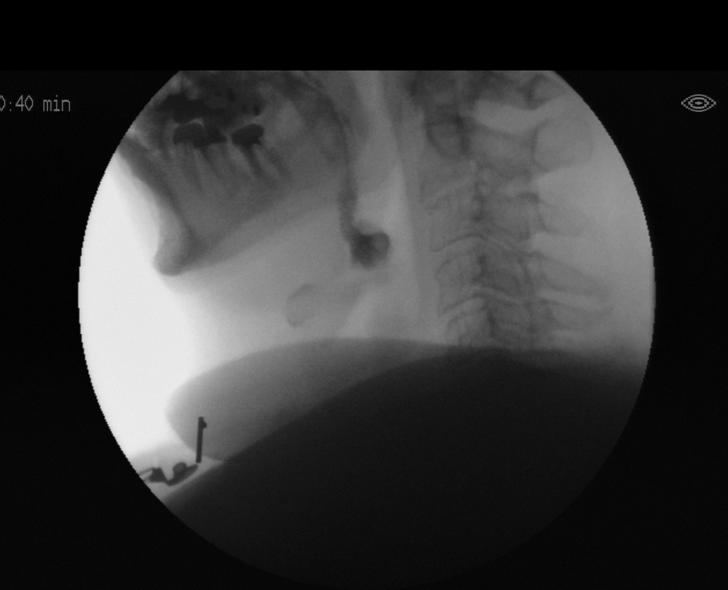

[Series 7: run · 1 of 127 frames shown (6 of 12)]
[frame 15/127]
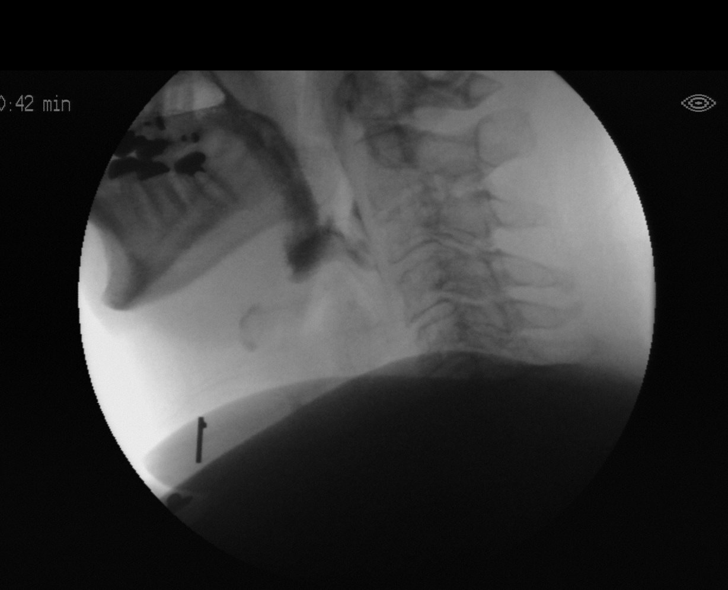

[Series 8: run · 2 of 406 frames shown (7 of 12)]
[frame 61/406]
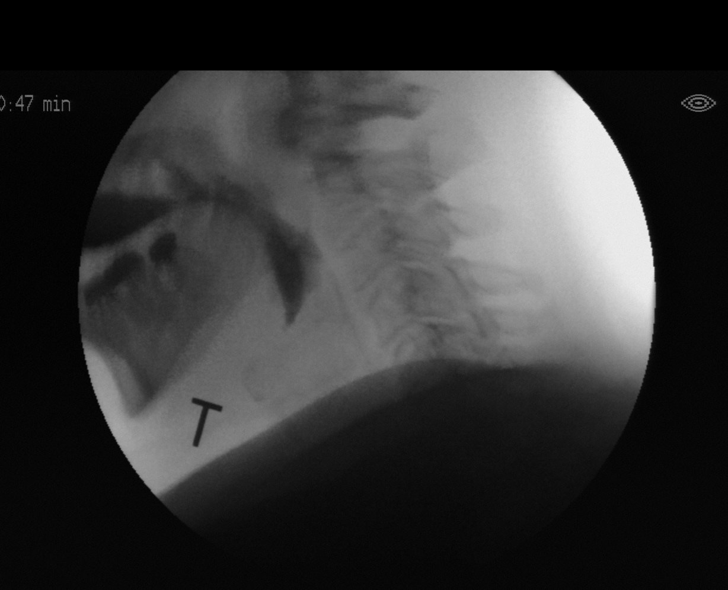
[frame 346/406]
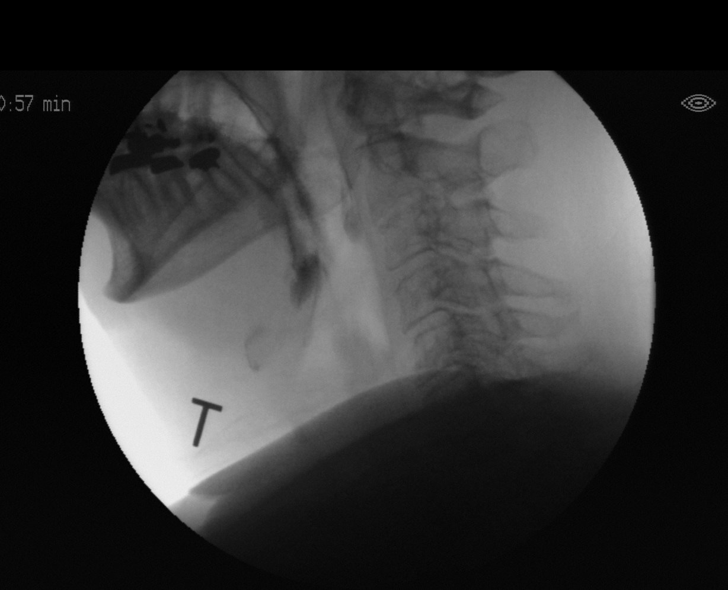

[Series 10: run · 1 of 292 frames shown (8 of 12)]
[frame 1/292]
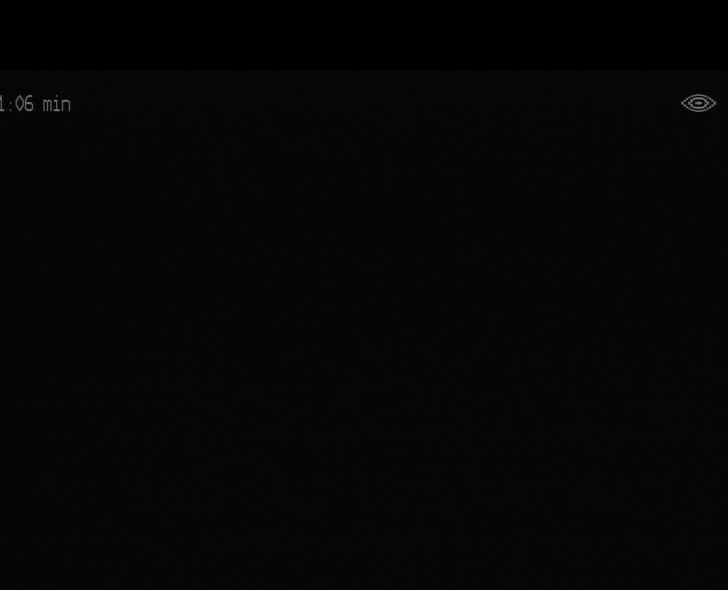

[Series 11: run · 1 of 242 frames shown (9 of 12)]
[frame 37/242]
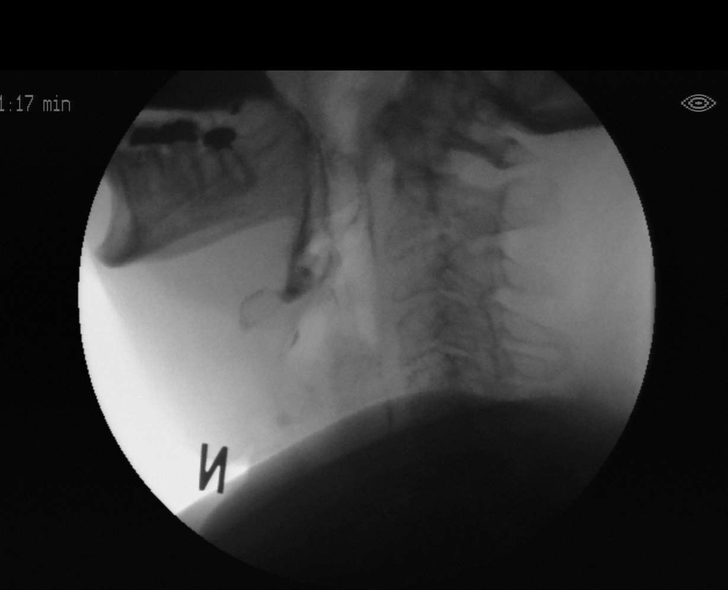

[Series 12: run · 1 of 78 frames shown (10 of 12)]
[frame 29/78]
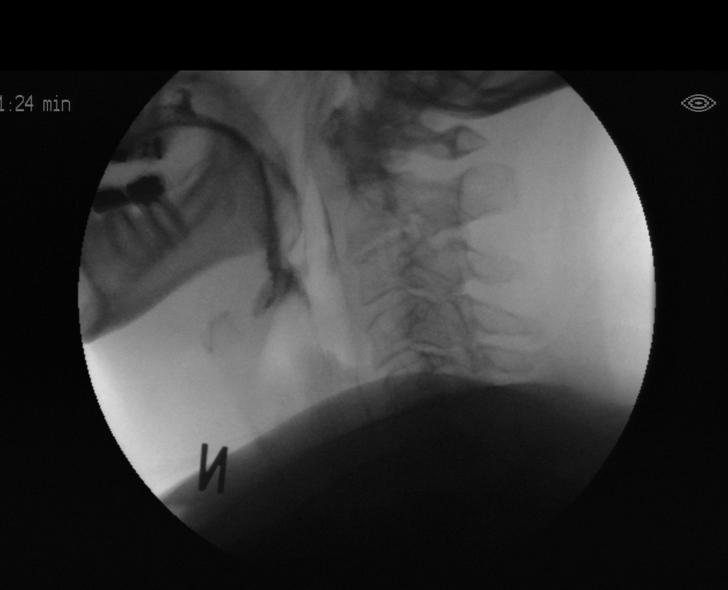

[Series 13: run · 1 of 32 frames shown (11 of 12)]
[frame 17/32]
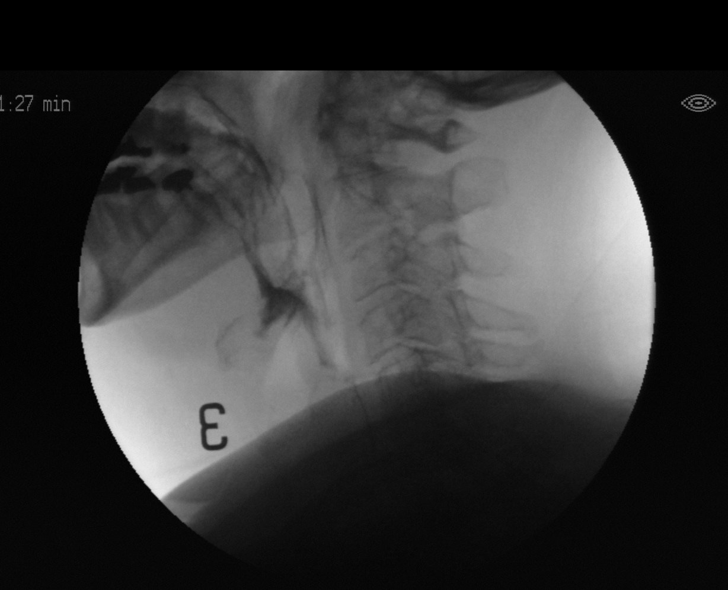

[Series 14: run · 1 of 101 frames shown (12 of 12)]
[frame 86/101]
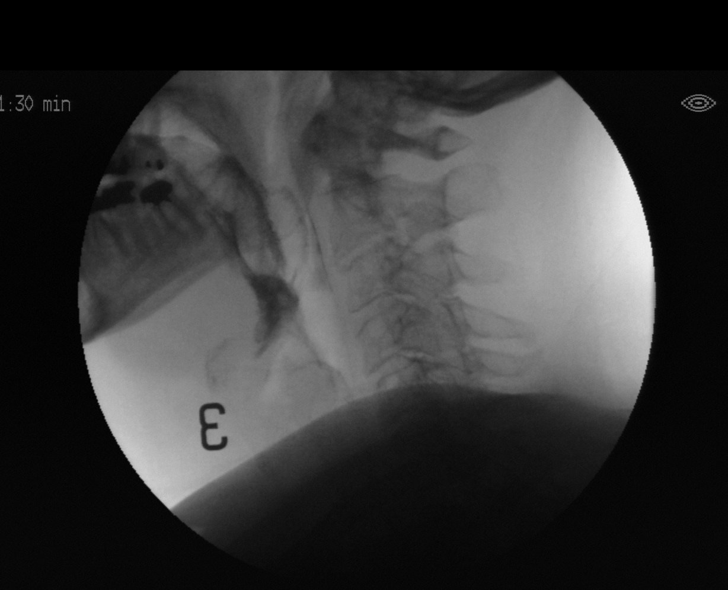

[13 of 24 positions shown; findings below may reference images not displayed]

FLUOROSCOPY FOR SWALLOWING FUNCTION STUDY:
Fluoroscopy was provided for swallowing function study, which was administered by a speech pathologist.  Final results and recommendations from this study are contained within the speech pathology report.

## 2019-04-30 IMAGING — DX DG ABD PORTABLE 1V
1 series · 1 of 1 positions shown · non-contrast
Comparison: 05/06/2018

CLINICAL DATA: Nasogastric tube placement.

EXAM:
PORTABLE ABDOMEN - 1 VIEW

[abdomen]
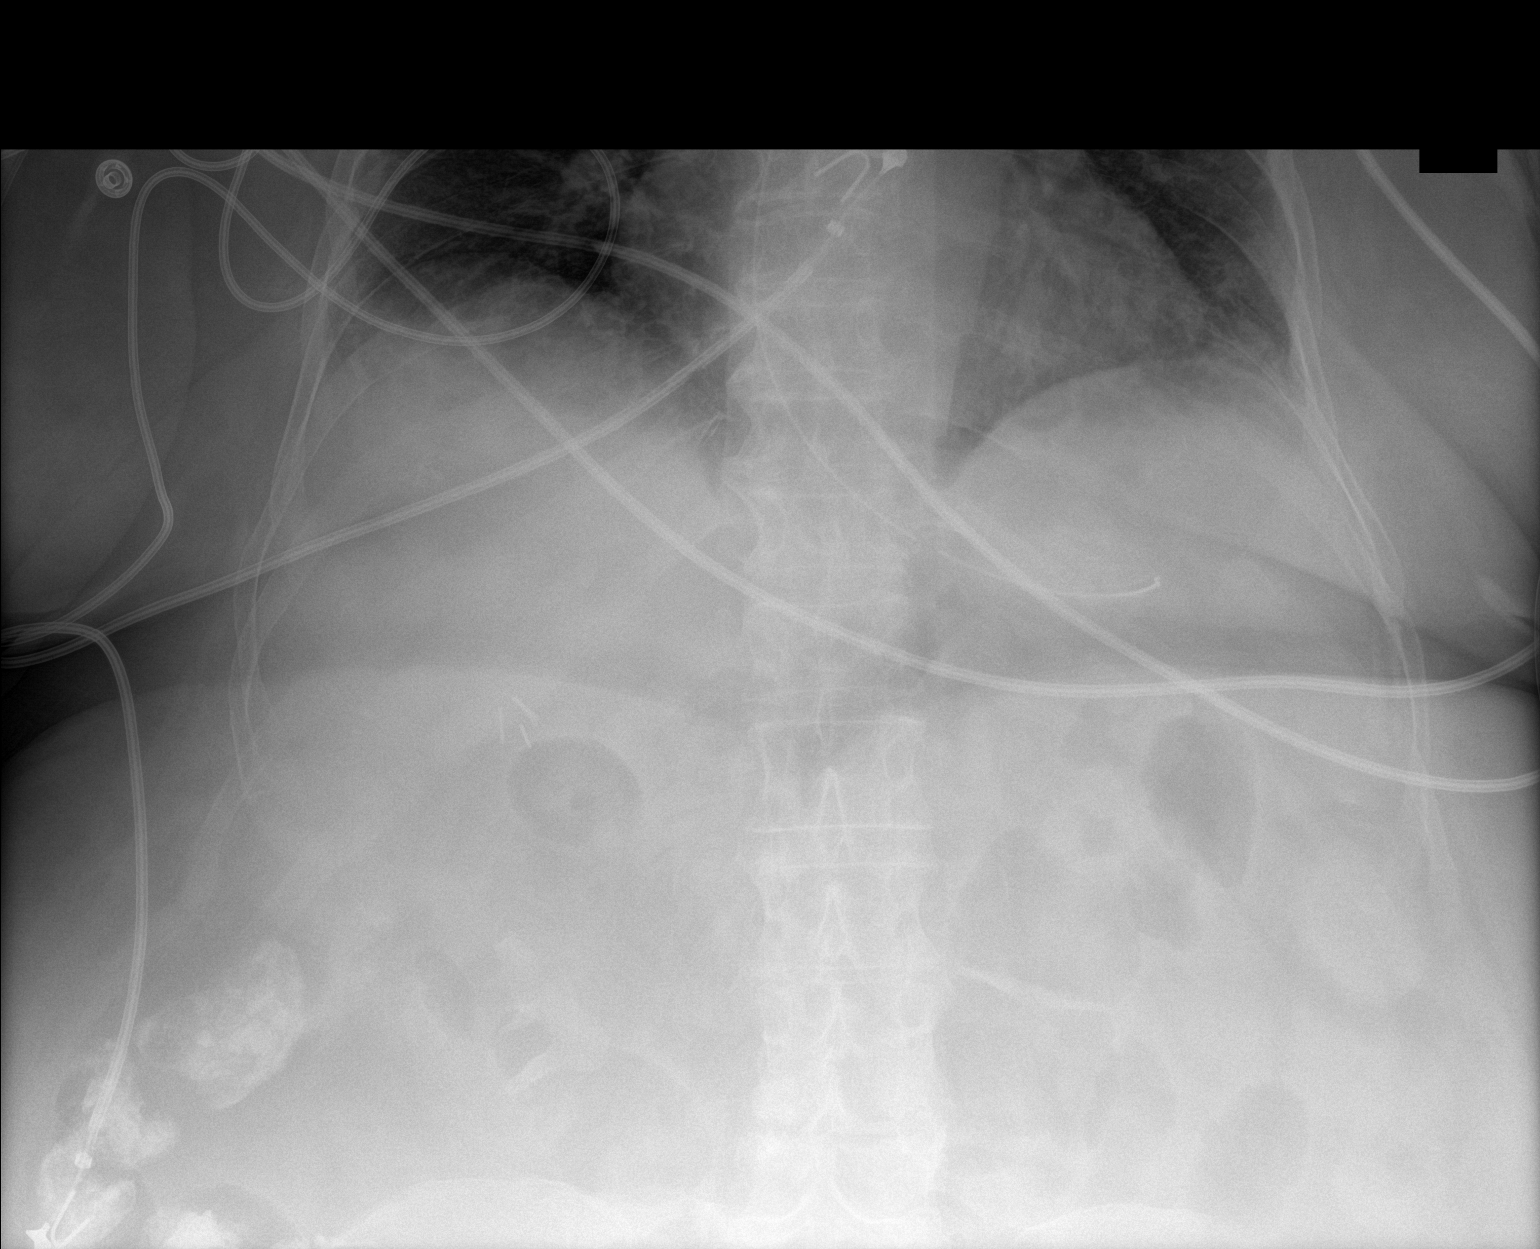

[1 of 1 positions shown; findings below may reference images not displayed]

FINDINGS: Nasogastric tube passes below the diaphragm into the proximal
stomach. Side hole lies at the level of the GE junction.

Normal bowel gas pattern.
IMPRESSION: 1. Nasogastric tube extends into the proximal stomach. Consider
inserting another 5-10 cm to allow the side hole to fully into the
stomach.

## 2019-04-30 IMAGING — DX DG ABD PORTABLE 1V
1 series · 1 of 1 positions shown · non-contrast
Comparison: 05/15/2018 chest radiograph.

CLINICAL DATA: Enteric tube placement.

EXAM:
PORTABLE ABDOMEN - 1 VIEW

[abdomen kub]
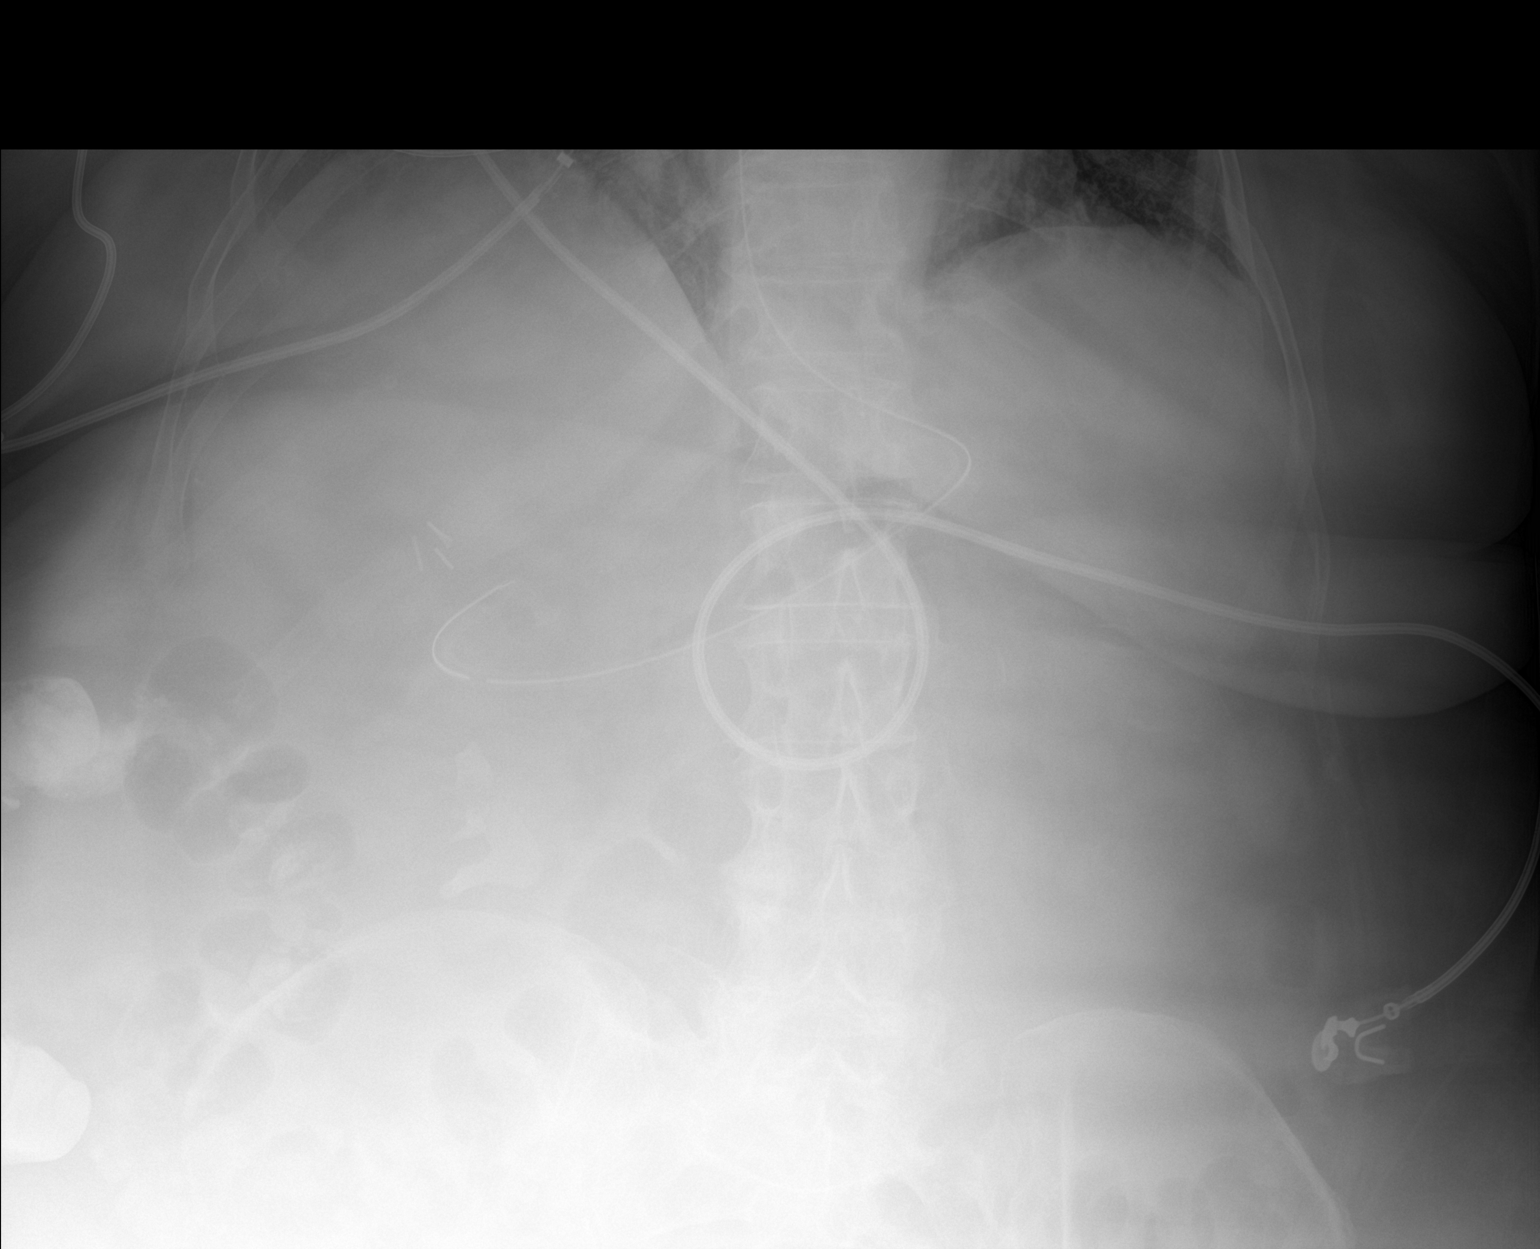

[1 of 1 positions shown; findings below may reference images not displayed]

FINDINGS: The enteric tube has been advanced with tip projecting over distal
stomach. Right upper quadrant surgical clips, presumably
cholecystectomy. Lower abdomen and right hemiabdomen are excluded
from the field of view. Visualized bowel gas pattern is normal.
IMPRESSION: Enteric tube tip has been advanced with tip projecting over distal
stomach.

By: Shinsaku Bruhl M.D.

## 2022-07-07 ENCOUNTER — Emergency Department (HOSPITAL_BASED_OUTPATIENT_CLINIC_OR_DEPARTMENT_OTHER): Payer: Medicare HMO

## 2022-07-07 ENCOUNTER — Emergency Department (HOSPITAL_BASED_OUTPATIENT_CLINIC_OR_DEPARTMENT_OTHER)
Admission: EM | Admit: 2022-07-07 | Discharge: 2022-07-09 | Disposition: A | Discharge status: Home / Self Care | Payer: Medicare HMO | Attending: Emergency Medicine | Admitting: Emergency Medicine

## 2022-07-07 ENCOUNTER — Encounter (HOSPITAL_BASED_OUTPATIENT_CLINIC_OR_DEPARTMENT_OTHER): Payer: Self-pay | Admitting: Emergency Medicine

## 2022-07-07 DIAGNOSIS — R0602 Shortness of breath: Secondary | ICD-10-CM | POA: Diagnosis present

## 2022-07-07 DIAGNOSIS — I480 Paroxysmal atrial fibrillation: Secondary | ICD-10-CM | POA: Insufficient documentation

## 2022-07-07 DIAGNOSIS — J189 Pneumonia, unspecified organism: Secondary | ICD-10-CM

## 2022-07-07 DIAGNOSIS — I10 Essential (primary) hypertension: Secondary | ICD-10-CM | POA: Insufficient documentation

## 2022-07-07 DIAGNOSIS — R0902 Hypoxemia: Secondary | ICD-10-CM

## 2022-07-07 DIAGNOSIS — Z7901 Long term (current) use of anticoagulants: Secondary | ICD-10-CM | POA: Insufficient documentation

## 2022-07-07 LAB — BASIC METABOLIC PANEL
Anion gap: 11 (ref 5–15)
BUN: 27 mg/dL — ABNORMAL HIGH (ref 8–23)
CO2: 25 mmol/L (ref 22–32)
Calcium: 9 mg/dL (ref 8.9–10.3)
Chloride: 100 mmol/L (ref 98–111)
Creatinine, Ser: 1.4 mg/dL — ABNORMAL HIGH (ref 0.44–1.00)
GFR, Estimated: 39 mL/min — ABNORMAL LOW (ref >60)
Glucose, Bld: 114 mg/dL — ABNORMAL HIGH (ref 70–99)
Potassium: 4.1 mmol/L (ref 3.5–5.1)
Sodium: 136 mmol/L (ref 135–145)

## 2022-07-07 LAB — D-DIMER, QUANTITATIVE: D-Dimer, Quant: 1.18 ug/mL-FEU — ABNORMAL HIGH (ref 0.00–0.50)

## 2022-07-07 LAB — CBC WITH DIFFERENTIAL/PLATELET
Abs Immature Granulocytes: 0.12 10*3/uL — ABNORMAL HIGH (ref 0.00–0.07)
Basophils Absolute: 0.1 10*3/uL (ref 0.0–0.1)
Basophils Relative: 1 %
Eosinophils Absolute: 0.3 10*3/uL (ref 0.0–0.5)
Eosinophils Relative: 3 %
HCT: 39.3 % (ref 36.0–46.0)
Hemoglobin: 13 g/dL (ref 12.0–15.0)
Immature Granulocytes: 1 %
Lymphocytes Relative: 13 %
Lymphs Abs: 1.6 10*3/uL (ref 0.7–4.0)
MCH: 29.1 pg (ref 26.0–34.0)
MCHC: 33.1 g/dL (ref 30.0–36.0)
MCV: 88.1 fL (ref 80.0–100.0)
Monocytes Absolute: 1.4 10*3/uL — ABNORMAL HIGH (ref 0.1–1.0)
Monocytes Relative: 12 %
Neutro Abs: 8.6 10*3/uL — ABNORMAL HIGH (ref 1.7–7.7)
Neutrophils Relative %: 70 %
Platelets: 260 10*3/uL (ref 150–400)
RBC: 4.46 MIL/uL (ref 3.87–5.11)
RDW: 14 % (ref 11.5–15.5)
WBC: 12 10*3/uL — ABNORMAL HIGH (ref 4.0–10.5)
nRBC: 0 % (ref 0.0–0.2)

## 2022-07-07 MED ORDER — METRONIDAZOLE 500 MG/100ML IV SOLN
500.0000 mg | Freq: Once | INTRAVENOUS | Status: AC
  Administered 2022-07-07: 500 mg via INTRAVENOUS
  Filled 2022-07-07: qty 100

## 2022-07-07 MED ORDER — SODIUM CHLORIDE 0.9 % IV SOLN
500.0000 mg | Freq: Once | INTRAVENOUS | Status: AC
  Administered 2022-07-07: 500 mg via INTRAVENOUS
  Filled 2022-07-07: qty 5

## 2022-07-07 MED ORDER — METRONIDAZOLE 500 MG/100ML IV SOLN
500.0000 mg | Freq: Three times a day (TID) | INTRAVENOUS | Status: DC
  Administered 2022-07-08 – 2022-07-09 (×5): 500 mg via INTRAVENOUS
  Filled 2022-07-07 (×5): qty 100

## 2022-07-07 MED ORDER — SODIUM CHLORIDE 0.9 % IV SOLN
1.0000 g | Freq: Once | INTRAVENOUS | Status: AC
  Administered 2022-07-07: 1 g via INTRAVENOUS
  Filled 2022-07-07: qty 10

## 2022-07-07 MED ORDER — IOHEXOL 350 MG/ML SOLN
75.0000 mL | Freq: Once | INTRAVENOUS | Status: AC | PRN
Start: 2022-07-07 — End: 2022-07-07
  Administered 2022-07-07: 75 mL via INTRAVENOUS

## 2022-07-07 MED ORDER — SODIUM CHLORIDE 0.9 % IV BOLUS
500.0000 mL | Freq: Once | INTRAVENOUS | Status: AC
  Administered 2022-07-07: 500 mL via INTRAVENOUS

## 2022-07-07 NOTE — ED Triage Notes (Signed)
Pt with cough and SHOB x 1 wk; negative Covid/flu Tues; was seen at another ED Tues for hypoxia, but was not admitted; referred here from UC today

## 2022-07-07 NOTE — ED Notes (Signed)
1 set blood cultures collected from [facility]. IV abx already started prior to blood culture order

## 2022-07-07 NOTE — ED Notes (Signed)
Pt in radiology 

## 2022-07-07 NOTE — ED Provider Notes (Addendum)
[facility] EMERGENCY DEPARTMENT Provider Note   CSN: [id number] Arrival date & time: 07/07/22  1413     History  Chief Complaint  Patient presents with   Cough   Shortness of Breath    Bridget Riley is a 81 y.o. female.  Patient is a 81 year old female who presents with cough and shortness of breath.  She has a history of hypertension, hyperlipidemia, paroxysmal atrial fibrillation on anticoagulants, obstructive sleep apnea.  She has had a cough for about 8 days.  Her family member at bedside helps provide story.  Her cough is sometimes productive of yellow sputum.  She denies any chest pain.  She does have some soreness in her abdomen from all the coughing.  She has some nasal congestion as well.  She has had some subjective daily fevers but she says she does not feel like they are very high.  She is at times had some posttussive emesis but no other vomiting.  No leg pain or swelling.  She was seen in the emergency department at [facility] on August 29.  She previously been seen at her primary care office for a COVID test which was negative.  At that point she was noted to be hypoxic.  In the emergency department, she had a negative chest x-ray.  Negative rapid flu.  Negative respiratory viral panel.  Negative COVID PCR.  She was not noted to be hypoxic, even on ambulation her lowest oxygen saturations were 92% per the ED notes.  She went to urgent care today with ongoing cough and symptoms.  They reported that her oxygen level was 91 to 92% and recommended she come to the emergency room for further evaluation.  She denies history of underlying lung disease.       Home Medications Prior to Admission medications   Not on File      Allergies    Tetracyclines & related, Penicillins, and Sulfa antibiotics    Review of Systems   Review of Systems  Constitutional:  Positive for fatigue and fever. Negative for chills and diaphoresis.  HENT:  Positive for  congestion and rhinorrhea. Negative for sneezing.   Eyes: Negative.   Respiratory:  Positive for cough and shortness of breath. Negative for chest tightness.   Cardiovascular:  Negative for chest pain and leg swelling.  Gastrointestinal:  Positive for vomiting (Posttussive). Negative for abdominal pain, blood in stool, diarrhea and nausea.  Genitourinary:  Negative for difficulty urinating, flank pain, frequency and hematuria.  Musculoskeletal:  Negative for arthralgias and back pain.  Skin:  Negative for rash.  Neurological:  Negative for dizziness, speech difficulty, weakness, numbness and headaches.    Physical Exam Updated Vital Signs BP 105/83   Pulse 85   Temp 99.5 F (37.5 C) (Oral)   Resp (!) 29   Ht 5\' 5"  (1.651 m)   Wt 122.5 kg   SpO2 92%   BMI 44.93 kg/m  Physical Exam Constitutional:      Appearance: She is well-developed. She is obese.  HENT:     Head: Normocephalic and atraumatic.  Eyes:     Pupils: Pupils are equal, round, and reactive to light.  Cardiovascular:     Rate and Rhythm: Normal rate and regular rhythm.     Heart sounds: Normal heart sounds.  Pulmonary:     Effort: Pulmonary effort is normal. No respiratory distress.     Breath sounds: Rhonchi present. No wheezing or rales.  Chest:  Chest wall: No tenderness.  Abdominal:     General: Bowel sounds are normal.     Palpations: Abdomen is soft.     Tenderness: There is no abdominal tenderness. There is no guarding or rebound.  Musculoskeletal:        General: Normal range of motion.     Cervical back: Normal range of motion and neck supple.     Comments: No edema or calf tenderness  Lymphadenopathy:     Cervical: No cervical adenopathy.  Skin:    General: Skin is warm and dry.     Findings: No rash.  Neurological:     Mental Status: She is alert and oriented to person, place, and time.     ED Results / Procedures / Treatments   Labs (all labs ordered are listed, but only abnormal  results are displayed) Labs Reviewed  BASIC METABOLIC PANEL - Abnormal; Notable for the following components:      Result Value   Glucose, Bld 114 (*)    BUN 27 (*)    Creatinine, Ser 1.40 (*)    GFR, Estimated 39 (*)    All other components within normal limits  CBC WITH DIFFERENTIAL/PLATELET - Abnormal; Notable for the following components:   WBC 12.0 (*)    Neutro Abs 8.6 (*)    Monocytes Absolute 1.4 (*)    Abs Immature Granulocytes 0.12 (*)    All other components within normal limits  D-DIMER, QUANTITATIVE - Abnormal; Notable for the following components:   D-Dimer, Quant 1.18 (*)    All other components within normal limits  CULTURE, BLOOD (ROUTINE X 2)  CULTURE, BLOOD (ROUTINE X 2)    EKG EKG Interpretation  Date/Time:  Sunday July 07 2022 15:32:51 EDT Ventricular Rate:  84 PR Interval:  144 QRS Duration: 93 QT Interval:  354 QTC Calculation: 419 R Axis:   -25 Text Interpretation: Sinus rhythm Left ventricular hypertrophy No old tracing to compare Confirmed by Rolan Bucco [id number]) on 07/07/2022 3:35:06 PM  Radiology CT Angio Chest PE W/Cm &/Or Wo Cm  Result Date: 07/07/2022 CLINICAL DATA:  Pulmonary embolism (PE) suspected, positive D-dimer EXAM: CT ANGIOGRAPHY CHEST WITH CONTRAST TECHNIQUE: Multidetector CT imaging of the chest was performed using the standard protocol during bolus administration of intravenous contrast. Multiplanar CT image reconstructions and MIPs were obtained to evaluate the vascular anatomy. RADIATION DOSE REDUCTION: This exam was performed according to the departmental dose-optimization program which includes automated exposure control, adjustment of the mA and/or kV according to patient size and/or use of iterative reconstruction technique. CONTRAST:  41mL OMNIPAQUE IOHEXOL 350 MG/ML SOLN COMPARISON:  None Available. FINDINGS: Cardiovascular: Satisfactory opacification of the pulmonary arteries to the segmental level. No evidence of pulmonary  embolism. Normal cardiac size.No pericardial disease. The thoracic aorta is unremarkable. Mediastinum/Nodes: Prominent hilar lymph nodes, likely reactive. The thyroid is unremarkable. Esophagus is unremarkable. Lungs/Pleura: There is multifocal airspace disease bilaterally most prominent in the lower lobes with basilar consolidations. Mild bronchial wall thickening with endobronchial material in the left lower lobe. No pleural effusion. No pneumothorax. Upper Abdomen: No acute abnormality. Musculoskeletal: There is an age-indeterminate superior endplate compression deformity of T4 with 15% height loss. Multilevel degenerative changes of the spine. No suspicious osseous lesion. Severe glenohumeral osteoarthritis. Review of the MIP images confirms the above findings. IMPRESSION: Multifocal pneumonia, potentially aspiration-related with endobronchial material noted in the left lower lobe. No evidence of pulmonary embolism. Age-indeterminate superior endplate compression deformity of T4 with 50% height loss. Electronically  Signed   By: Caprice Renshaw M.D.   On: 07/07/2022 17:15   DG Chest 2 View  Result Date: 07/07/2022 CLINICAL DATA:  Cough and shortness of breath. EXAM: CHEST - 2 VIEW COMPARISON:  07/02/2022, additional priors reviewed FINDINGS: The cardiomediastinal contours are normal. Interstitial coarsening is unchanged from recent prior. There are ill-defined opacities in the lung bases, not definitively seen previously. Pulmonary vasculature is normal. No pleural effusion or pneumothorax. Thoracic spondylosis with spurring. No acute osseous abnormalities are seen. IMPRESSION: 1. Ill-defined opacities in the lung bases, may be atelectasis or pneumonia. 2. Interstitial coarsening is unchanged from recent prior exam, but increased from more remote priors, question atypical infection. Electronically Signed   By: Narda Rutherford M.D.   On: 07/07/2022 15:40    Procedures Procedures    Medications Ordered in  ED Medications  azithromycin (ZITHROMAX) 500 mg in sodium chloride 0.9 % 250 mL IVPB (has no administration in time range)  metroNIDAZOLE (FLAGYL) IVPB 500 mg (has no administration in time range)  iohexol (OMNIPAQUE) 350 MG/ML injection 75 mL (75 mLs Intravenous Contrast Given 07/07/22 1641)  sodium chloride 0.9 % bolus 500 mL (500 mLs Intravenous New Bag/Given 07/07/22 1708)  cefTRIAXone (ROCEPHIN) 1 g in sodium chloride 0.9 % 100 mL IVPB (1 g Intravenous New Bag/Given 07/07/22 1744)    ED Course/ Medical Decision Making/ A&P                           Medical Decision Making Amount and/or Complexity of Data Reviewed Labs: ordered. Radiology: ordered.  Risk Prescription drug management. Decision regarding hospitalization.   Patient is a 81 year old female who presents with cough and shortness of breath.  Chest x-ray showed some possible bilateral pneumonia versus atelectasis in the lower lobes.  She is not hypoxic at rest but when she moves around she will at times drop down into the 80s here in the ED.  CT scan was performed.  There is no evidence of PE.  She does have evidence of multifocal pneumonia with possible aspiration component.  Her labs are nonconcerning.  Her creatinine is slightly elevated as compared to her prior labs.  She had some lab work done on her recent ED visit which showed a creatinine of 1.2.  This was based on chart review.  She was started on IV antibiotics.  She had a COVID test by PCR that was negative on 8/29 with the same symptoms.  I did not repeat it.  She also had a viral respiratory panel that was negative so I did not repeat this.  She prefers to be admitted to [facility] versus a [facility] facility.  She wants to stay in this area.  I contacted that facility and the universal acceptor has accepted her for admission but she is currently on a wait list for a bed.  She was not able to give me the accepting MD at this time.  I did explain to the patient that  they currently do not have any beds but she prefers to wait for a bed assignment.  Final Clinical Impression(s) / ED Diagnoses Final diagnoses:  Pneumonia of both lungs due to infectious organism, unspecified part of lung  Hypoxia    Rx / DC Orders ED Discharge Orders     None         Rolan Bucco, MD 07/07/22 1830    Rolan Bucco, MD 07/07/22 2227

## 2022-07-07 NOTE — Progress Notes (Signed)
Patient ambulated with pulse ox. Initially did not drop SAT, but getting back in bed and trying to scoot up, SAT 87%. Returned to 92% after getting her settled. RN and MD aware

## 2022-07-08 ENCOUNTER — Other Ambulatory Visit: Payer: Self-pay

## 2022-07-08 MED ORDER — MELATONIN 3 MG PO TABS
1.5000 mg | ORAL_TABLET | Freq: Every day | ORAL | Status: DC
  Filled 2022-07-08: qty 0.5

## 2022-07-08 MED ORDER — SERTRALINE HCL 25 MG PO TABS
50.0000 mg | ORAL_TABLET | Freq: Every day | ORAL | Status: DC
  Administered 2022-07-08 – 2022-07-09 (×2): 50 mg via ORAL
  Filled 2022-07-08 (×2): qty 2

## 2022-07-08 MED ORDER — LEVOTHYROXINE SODIUM 25 MCG PO TABS
75.0000 ug | ORAL_TABLET | Freq: Every day | ORAL | Status: DC
  Administered 2022-07-08 – 2022-07-09 (×2): 75 ug via ORAL
  Filled 2022-07-08 (×2): qty 3

## 2022-07-08 MED ORDER — SODIUM CHLORIDE 0.9 % IV SOLN
1.0000 g | INTRAVENOUS | Status: DC
  Administered 2022-07-08: 1 g via INTRAVENOUS
  Filled 2022-07-08: qty 10

## 2022-07-08 MED ORDER — PROPRANOLOL HCL 10 MG PO TABS
10.0000 mg | ORAL_TABLET | Freq: Three times a day (TID) | ORAL | Status: DC
  Filled 2022-07-08: qty 1

## 2022-07-08 MED ORDER — ATORVASTATIN CALCIUM 10 MG PO TABS
20.0000 mg | ORAL_TABLET | Freq: Every day | ORAL | Status: DC
  Administered 2022-07-08 – 2022-07-09 (×2): 20 mg via ORAL
  Filled 2022-07-08 (×2): qty 2

## 2022-07-08 MED ORDER — APIXABAN 2.5 MG PO TABS
5.0000 mg | ORAL_TABLET | Freq: Two times a day (BID) | ORAL | Status: DC
  Administered 2022-07-08 – 2022-07-09 (×4): 5 mg via ORAL
  Filled 2022-07-08 (×4): qty 2

## 2022-07-08 MED ORDER — VITAMIN B-12 1000 MCG PO TABS
1000.0000 ug | ORAL_TABLET | Freq: Every day | ORAL | Status: DC
  Filled 2022-07-08: qty 1

## 2022-07-08 MED ORDER — PREGABALIN 25 MG PO CAPS
50.0000 mg | ORAL_CAPSULE | Freq: Two times a day (BID) | ORAL | Status: DC
  Administered 2022-07-08 – 2022-07-09 (×4): 50 mg via ORAL
  Filled 2022-07-08 (×4): qty 2

## 2022-07-08 MED ORDER — SODIUM CHLORIDE 0.9 % IV SOLN: INTRAVENOUS | Status: DC | PRN

## 2022-07-08 MED ORDER — SODIUM CHLORIDE 0.9 % IV SOLN
500.0000 mg | INTRAVENOUS | Status: DC
  Administered 2022-07-08: 500 mg via INTRAVENOUS
  Filled 2022-07-08: qty 5

## 2022-07-08 NOTE — ED Notes (Signed)
Called Transport line for [employer] for an updated [employer] bed status for pt at 2000. Transfer line states that the pt will most likely get a bed "later tonight or tomorrow morning"

## 2022-07-08 NOTE — ED Notes (Signed)
Pt care taken, straighten and cleaned patients bed, assisted her into a more comfortable position.

## 2022-07-09 NOTE — ED Notes (Addendum)
Verbal consent provided by patient, okay to transfer to [facility]. Unable to sign due to signature pad working. This RN witnessed verbal consent.

## 2022-07-09 NOTE — ED Notes (Signed)
Report given to [employer], ETA 

## 2022-07-12 LAB — CULTURE, BLOOD (ROUTINE X 2)
Culture: NO GROWTH
Special Requests: ADEQUATE
